# Patient Record
Sex: Male | Born: 1960 | Race: White | Hispanic: No | Marital: Single | Smoking: Former smoker
Health system: Southern US, Community
[De-identification: ages and names within clinical notes are randomized; demographics above are authoritative.]

## PROBLEM LIST (undated history)

## (undated) DIAGNOSIS — G4733 Obstructive sleep apnea (adult) (pediatric): Secondary | ICD-10-CM

## (undated) DIAGNOSIS — I10 Essential (primary) hypertension: Secondary | ICD-10-CM

## (undated) DIAGNOSIS — R0789 Other chest pain: Secondary | ICD-10-CM

## (undated) DIAGNOSIS — N529 Male erectile dysfunction, unspecified: Secondary | ICD-10-CM

## (undated) DIAGNOSIS — F419 Anxiety disorder, unspecified: Secondary | ICD-10-CM

## (undated) DIAGNOSIS — K219 Gastro-esophageal reflux disease without esophagitis: Secondary | ICD-10-CM

## (undated) DIAGNOSIS — E785 Hyperlipidemia, unspecified: Secondary | ICD-10-CM

## (undated) HISTORY — DX: Hyperlipidemia, unspecified: E78.5

## (undated) HISTORY — DX: Gastro-esophageal reflux disease without esophagitis: K21.9

## (undated) HISTORY — DX: Anxiety disorder, unspecified: F41.9

## (undated) HISTORY — DX: Male erectile dysfunction, unspecified: N52.9

## (undated) HISTORY — DX: Other chest pain: R07.89

## (undated) HISTORY — DX: Obstructive sleep apnea (adult) (pediatric): G47.33

## (undated) HISTORY — PX: HERNIA REPAIR: SHX51

---

## 1999-02-27 ENCOUNTER — Ambulatory Visit (HOSPITAL_BASED_OUTPATIENT_CLINIC_OR_DEPARTMENT_OTHER): Admission: RE | Admit: 1999-02-27 | Discharge: 1999-02-27 | Payer: Self-pay | Admitting: General Surgery

## 2002-10-02 ENCOUNTER — Emergency Department (HOSPITAL_COMMUNITY): Admission: EM | Admit: 2002-10-02 | Discharge: 2002-10-02 | Payer: Self-pay | Admitting: *Deleted

## 2002-10-06 ENCOUNTER — Ambulatory Visit (HOSPITAL_BASED_OUTPATIENT_CLINIC_OR_DEPARTMENT_OTHER): Admission: RE | Admit: 2002-10-06 | Discharge: 2002-10-06 | Payer: Self-pay | Admitting: Urology

## 2005-04-12 ENCOUNTER — Emergency Department (HOSPITAL_COMMUNITY): Admission: EM | Admit: 2005-04-12 | Discharge: 2005-04-12 | Payer: Self-pay | Admitting: Emergency Medicine

## 2006-05-02 DIAGNOSIS — L259 Unspecified contact dermatitis, unspecified cause: Secondary | ICD-10-CM | POA: Insufficient documentation

## 2007-01-23 DIAGNOSIS — R5383 Other fatigue: Secondary | ICD-10-CM | POA: Insufficient documentation

## 2007-01-23 DIAGNOSIS — R002 Palpitations: Secondary | ICD-10-CM | POA: Insufficient documentation

## 2010-10-21 ENCOUNTER — Emergency Department (HOSPITAL_COMMUNITY)
Admission: EM | Admit: 2010-10-21 | Discharge: 2010-10-22 | Discharge status: Against Medical Advice | Payer: 59 | Attending: Emergency Medicine | Admitting: Emergency Medicine

## 2010-10-21 ENCOUNTER — Emergency Department (HOSPITAL_COMMUNITY): Payer: 59

## 2010-10-21 DIAGNOSIS — R110 Nausea: Secondary | ICD-10-CM | POA: Insufficient documentation

## 2010-10-21 DIAGNOSIS — R61 Generalized hyperhidrosis: Secondary | ICD-10-CM | POA: Insufficient documentation

## 2010-10-21 DIAGNOSIS — R079 Chest pain, unspecified: Secondary | ICD-10-CM | POA: Insufficient documentation

## 2010-10-21 LAB — DIFFERENTIAL
Basophils Relative: 1 % (ref 0–1)
Eosinophils Absolute: 0.1 10*3/uL (ref 0.0–0.7)
Eosinophils Relative: 1 % (ref 0–5)
Monocytes Relative: 7 % (ref 3–12)
Neutrophils Relative %: 43 % (ref 43–77)

## 2010-10-21 LAB — CBC
MCH: 33.2 pg (ref 26.0–34.0)
Platelets: 225 10*3/uL (ref 150–400)
RBC: 4.85 MIL/uL (ref 4.22–5.81)
RDW: 12.8 % (ref 11.5–15.5)
WBC: 9.4 10*3/uL (ref 4.0–10.5)

## 2010-10-21 LAB — COMPREHENSIVE METABOLIC PANEL
Albumin: 3.9 g/dL (ref 3.5–5.2)
Alkaline Phosphatase: 81 U/L (ref 39–117)
BUN: 10 mg/dL (ref 6–23)
CO2: 26 mEq/L (ref 19–32)
Chloride: 106 mEq/L (ref 96–112)
Creatinine, Ser: 0.98 mg/dL (ref 0.4–1.5)
GFR calc non Af Amer: >60 mL/min (ref >60)
Glucose, Bld: 106 mg/dL — ABNORMAL HIGH (ref 70–99)
Total Bilirubin: 0.4 mg/dL (ref 0.3–1.2)

## 2010-10-21 LAB — POCT CARDIAC MARKERS: Troponin i, poc: <0.05 ng/mL (ref 0.00–0.09)

## 2010-10-23 DIAGNOSIS — R404 Transient alteration of awareness: Secondary | ICD-10-CM | POA: Insufficient documentation

## 2010-10-24 DIAGNOSIS — E781 Pure hyperglyceridemia: Secondary | ICD-10-CM | POA: Insufficient documentation

## 2010-12-21 DIAGNOSIS — M064 Inflammatory polyarthropathy: Secondary | ICD-10-CM | POA: Insufficient documentation

## 2010-12-21 DIAGNOSIS — W57XXXA Bitten or stung by nonvenomous insect and other nonvenomous arthropods, initial encounter: Secondary | ICD-10-CM | POA: Insufficient documentation

## 2010-12-21 DIAGNOSIS — Z0000 Encounter for general adult medical examination without abnormal findings: Secondary | ICD-10-CM | POA: Insufficient documentation

## 2011-01-04 DIAGNOSIS — E291 Testicular hypofunction: Secondary | ICD-10-CM | POA: Insufficient documentation

## 2011-01-04 DIAGNOSIS — K8010 Calculus of gallbladder with chronic cholecystitis without obstruction: Secondary | ICD-10-CM | POA: Insufficient documentation

## 2011-04-19 DIAGNOSIS — Z23 Encounter for immunization: Secondary | ICD-10-CM | POA: Insufficient documentation

## 2011-04-19 DIAGNOSIS — R030 Elevated blood-pressure reading, without diagnosis of hypertension: Secondary | ICD-10-CM | POA: Insufficient documentation

## 2011-07-26 DIAGNOSIS — N529 Male erectile dysfunction, unspecified: Secondary | ICD-10-CM | POA: Insufficient documentation

## 2013-09-10 ENCOUNTER — Ambulatory Visit: Payer: 59 | Admitting: *Deleted

## 2013-10-22 ENCOUNTER — Ambulatory Visit: Payer: 59 | Admitting: *Deleted

## 2014-10-24 ENCOUNTER — Encounter (HOSPITAL_COMMUNITY): Payer: Self-pay | Admitting: Emergency Medicine

## 2014-10-24 ENCOUNTER — Observation Stay (HOSPITAL_COMMUNITY)
Admission: EM | Admit: 2014-10-24 | Discharge: 2014-10-26 | Disposition: A | Discharge status: Home / Self Care | Payer: No Typology Code available for payment source | Attending: Surgery | Admitting: Surgery

## 2014-10-24 DIAGNOSIS — Z6834 Body mass index (BMI) 34.0-34.9, adult: Secondary | ICD-10-CM | POA: Insufficient documentation

## 2014-10-24 DIAGNOSIS — E669 Obesity, unspecified: Secondary | ICD-10-CM | POA: Diagnosis not present

## 2014-10-24 DIAGNOSIS — R1011 Right upper quadrant pain: Secondary | ICD-10-CM | POA: Diagnosis present

## 2014-10-24 DIAGNOSIS — I10 Essential (primary) hypertension: Secondary | ICD-10-CM | POA: Diagnosis not present

## 2014-10-24 DIAGNOSIS — F17210 Nicotine dependence, cigarettes, uncomplicated: Secondary | ICD-10-CM | POA: Diagnosis not present

## 2014-10-24 DIAGNOSIS — K8000 Calculus of gallbladder with acute cholecystitis without obstruction: Principal | ICD-10-CM | POA: Diagnosis present

## 2014-10-24 DIAGNOSIS — Z7982 Long term (current) use of aspirin: Secondary | ICD-10-CM | POA: Diagnosis not present

## 2014-10-24 DIAGNOSIS — R109 Unspecified abdominal pain: Secondary | ICD-10-CM

## 2014-10-24 HISTORY — DX: Essential (primary) hypertension: I10

## 2014-10-24 LAB — COMPREHENSIVE METABOLIC PANEL
ALT: 16 U/L (ref 0–53)
AST: 26 U/L (ref 0–37)
Albumin: 4.1 g/dL (ref 3.5–5.2)
Alkaline Phosphatase: 97 U/L (ref 39–117)
Anion gap: 8 (ref 5–15)
BUN: 14 mg/dL (ref 6–23)
CALCIUM: 9.6 mg/dL (ref 8.4–10.5)
CO2: 27 mmol/L (ref 19–32)
CREATININE: 0.98 mg/dL (ref 0.50–1.35)
Chloride: 105 mmol/L (ref 96–112)
GFR calc non Af Amer: >90 mL/min (ref >90)
GLUCOSE: 124 mg/dL — AB (ref 70–99)
Potassium: 4.2 mmol/L (ref 3.5–5.1)
SODIUM: 140 mmol/L (ref 135–145)
TOTAL PROTEIN: 7.4 g/dL (ref 6.0–8.3)
Total Bilirubin: 1.1 mg/dL (ref 0.3–1.2)

## 2014-10-24 LAB — CBC
HEMATOCRIT: 48.1 % (ref 39.0–52.0)
HEMOGLOBIN: 17.4 g/dL — AB (ref 13.0–17.0)
MCH: 33.6 pg (ref 26.0–34.0)
MCHC: 36.2 g/dL — AB (ref 30.0–36.0)
MCV: 92.9 fL (ref 78.0–100.0)
Platelets: 250 10*3/uL (ref 150–400)
RBC: 5.18 MIL/uL (ref 4.22–5.81)
RDW: 13.2 % (ref 11.5–15.5)
WBC: 14.2 10*3/uL — ABNORMAL HIGH (ref 4.0–10.5)

## 2014-10-24 LAB — LIPASE, BLOOD: LIPASE: 23 U/L (ref 11–59)

## 2014-10-24 MED ORDER — ONDANSETRON HCL 4 MG/2ML IJ SOLN
4.0000 mg | Freq: Once | INTRAMUSCULAR | Status: AC
  Administered 2014-10-24: 4 mg via INTRAVENOUS
  Filled 2014-10-24: qty 2

## 2014-10-24 MED ORDER — MORPHINE SULFATE 4 MG/ML IJ SOLN
4.0000 mg | Freq: Once | INTRAMUSCULAR | Status: AC
  Administered 2014-10-24: 4 mg via INTRAVENOUS
  Filled 2014-10-24: qty 1

## 2014-10-24 NOTE — ED Notes (Signed)
Pt reports hx of gall stones 

## 2014-10-24 NOTE — ED Notes (Signed)
Pt presents with RUQ pain onset Friday- pt describes pain as cramping sensation that has gotten worse since Friday.  Pt was seen at an urgent care center today and given Rx for Protonix but has had no relief.  Admits to nausea, denies diarrhea.

## 2014-10-24 NOTE — ED Provider Notes (Signed)
CSN: [id number]     Arrival date & time 10/24/14  1833 History   First MD Initiated Contact with Patient 10/24/14 2322     Chief Complaint  Patient presents with  . Abdominal Pain     (Consider location/radiation/quality/duration/timing/severity/associated sxs/prior Treatment) Patient is a 54 y.o. male presenting with abdominal pain. The history is provided by the patient and medical records.  Abdominal Pain Associated symptoms: nausea     This is a 54 year old male with past medical history significant for hypertension, presenting to the ED for right upper quadrant abdominal pain which is been ongoing for the past 4 days. Patient states pain is constant, but worse after eating. He endorses some nausea but denies vomiting. States he has had decreased PO intake because of this and states he is afraid to eat.  Patient does admit to eating a lot of fatty and greasy foods-- BBQ, fried chicken, etc.  Denies fever, chills, sweats.  No flank pain or urinary symptoms.  BMs regular.  No chest pain or SOB.  Patient has hx of hernia repair and gallstones.  States he was evaluated for gallstones by general surgery 10+ years ago but told he did not need surgery at that time.  VSS.  Past Medical History  Diagnosis Date  . Hypertension    History reviewed. No pertinent past surgical history. No family history on file. History  Substance Use Topics  . Smoking status: Never Smoker   . Smokeless tobacco: Not on file  . Alcohol Use: No    Review of Systems  Gastrointestinal: Positive for nausea and abdominal pain.  All other systems reviewed and are negative.     Allergies  Review of patient's allergies indicates no known allergies.  Home Medications   Prior to Admission medications   Medication Sig Start Date End Date Taking? Authorizing Provider  alum & mag hydroxide-simeth (MAALOX/MYLANTA) 200-200-20 MG/5ML suspension Take 30 mLs by mouth every 6 (six) hours as needed for indigestion or  heartburn.   Yes Historical Provider, MD  aspirin EC 81 MG tablet Take 81 mg by mouth daily.   Yes Historical Provider, MD  atorvastatin (LIPITOR) 10 MG tablet Take 10 mg by mouth daily.   Yes Historical Provider, MD  lisinopril (PRINIVIL,ZESTRIL) 10 MG tablet Take 10 mg by mouth daily.   Yes Historical Provider, MD  pantoprazole (PROTONIX) 40 MG tablet Take 40 mg by mouth daily.   Yes Historical Provider, MD   BP 140/89 mmHg  Pulse 95  Temp(Src) 98.5 F (36.9 C)  Resp 18  Ht 6' (1.829 m)  Wt 260 lb (117.935 kg)  BMI 35.25 kg/m2  SpO2 95%   Physical Exam  Constitutional: He is oriented to person, place, and time. He appears well-developed and well-nourished. No distress.  HENT:  Head: Normocephalic and atraumatic.  Mouth/Throat: Oropharynx is clear and moist.  Eyes: Conjunctivae and EOM are normal. Pupils are equal, round, and reactive to light.  Neck: Normal range of motion. Neck supple.  Cardiovascular: Normal rate, regular rhythm and normal heart sounds.   Pulmonary/Chest: Effort normal and breath sounds normal.  Abdominal: Soft. Bowel sounds are normal. There is tenderness in the right upper quadrant and epigastric area. There is no rigidity, no guarding and no CVA tenderness.  Musculoskeletal: Normal range of motion.  Neurological: He is alert and oriented to person, place, and time.  Skin: Skin is warm and dry. He is not diaphoretic.  Psychiatric: He has a normal mood and affect.  Nursing  note and vitals reviewed.   ED Course  Procedures (including critical care time) Labs Review Labs Reviewed  CBC - Abnormal; Notable for the following:    WBC 14.2 (*)    Hemoglobin 17.4 (*)    MCHC 36.2 (*)    All other components within normal limits  COMPREHENSIVE METABOLIC PANEL - Abnormal; Notable for the following:    Glucose, Bld 124 (*)    All other components within normal limits  LIPASE, BLOOD    Imaging Review No results found.   EKG Interpretation None       MDM   Final diagnoses:  Abdominal pain, unspecified abdominal location   54 year old male with right upper quadrant abdominal pain for the past 3 days. Pain is notably worse after eating. He does admit to history of gallstones. On exam, patient is afebrile and nontoxic in appearance. He has tenderness in his RUQ and epigastrium.  Labwork is overall reassuring, leukocytosis noted at 14.2.  Will plan for u/s abdomen.  Patient given morphine and zofran.  1:18 AM U/s abdomen pending for evaluation of possible cholecystitis vs symptomatic gallstones.  Care signed out to PA Upstill-- will follow results and dispo accordingly.  Garlon HatchetLisa M Cierah Crader, PA-C 10/25/14 0119  Garlon HatchetLisa M Kally Cadden, PA-C 10/25/14 0120  Mirian MoMatthew Gentry, MD 10/25/14 [phone]

## 2014-10-25 ENCOUNTER — Observation Stay (HOSPITAL_COMMUNITY): Payer: No Typology Code available for payment source | Admitting: Critical Care Medicine

## 2014-10-25 ENCOUNTER — Emergency Department (HOSPITAL_COMMUNITY): Payer: No Typology Code available for payment source

## 2014-10-25 ENCOUNTER — Encounter (HOSPITAL_COMMUNITY): Payer: Self-pay | Admitting: Critical Care Medicine

## 2014-10-25 ENCOUNTER — Encounter (HOSPITAL_COMMUNITY)
Admission: EM | Disposition: A | Discharge status: Home / Self Care | Payer: Self-pay | Source: Home / Self Care | Attending: Emergency Medicine

## 2014-10-25 DIAGNOSIS — K8000 Calculus of gallbladder with acute cholecystitis without obstruction: Secondary | ICD-10-CM | POA: Diagnosis present

## 2014-10-25 HISTORY — PX: CHOLECYSTECTOMY: SHX55

## 2014-10-25 LAB — SURGICAL PCR SCREEN
MRSA, PCR: NEGATIVE
Staphylococcus aureus: NEGATIVE

## 2014-10-25 SURGERY — LAPAROSCOPIC CHOLECYSTECTOMY
Anesthesia: General | Site: Abdomen

## 2014-10-25 MED ORDER — GLYCOPYRROLATE 0.2 MG/ML IJ SOLN
INTRAMUSCULAR | Status: AC
  Filled 2014-10-25: qty 1

## 2014-10-25 MED ORDER — HYDROMORPHONE HCL 1 MG/ML IJ SOLN
1.0000 mg | INTRAMUSCULAR | Status: DC | PRN
  Administered 2014-10-25: 1 mg via INTRAVENOUS
  Filled 2014-10-25: qty 1

## 2014-10-25 MED ORDER — OXYCODONE-ACETAMINOPHEN 5-325 MG PO TABS
ORAL_TABLET | ORAL | Status: AC
  Administered 2014-10-25: 2 via ORAL
  Filled 2014-10-25: qty 2

## 2014-10-25 MED ORDER — GLYCOPYRROLATE 0.2 MG/ML IJ SOLN
INTRAMUSCULAR | Status: DC | PRN
  Administered 2014-10-25: 0.4 mg via INTRAVENOUS

## 2014-10-25 MED ORDER — CEFTRIAXONE SODIUM IN DEXTROSE 40 MG/ML IV SOLN
2.0000 g | INTRAVENOUS | Status: DC
  Administered 2014-10-25 – 2014-10-26 (×2): 2 g via INTRAVENOUS
  Filled 2014-10-25 (×2): qty 50

## 2014-10-25 MED ORDER — FENTANYL CITRATE 0.05 MG/ML IJ SOLN
INTRAMUSCULAR | Status: AC
  Filled 2014-10-25: qty 5

## 2014-10-25 MED ORDER — PROPOFOL 10 MG/ML IV BOLUS
INTRAVENOUS | Status: DC | PRN
  Administered 2014-10-25: 200 mg via INTRAVENOUS

## 2014-10-25 MED ORDER — PROPOFOL 10 MG/ML IV BOLUS
INTRAVENOUS | Status: AC
  Filled 2014-10-25: qty 20

## 2014-10-25 MED ORDER — NEOSTIGMINE METHYLSULFATE 10 MG/10ML IV SOLN
INTRAVENOUS | Status: DC | PRN
  Administered 2014-10-25: 3 mg via INTRAVENOUS

## 2014-10-25 MED ORDER — VECURONIUM BROMIDE 10 MG IV SOLR
INTRAVENOUS | Status: DC | PRN
  Administered 2014-10-25: 3 mg via INTRAVENOUS

## 2014-10-25 MED ORDER — PANTOPRAZOLE SODIUM 40 MG PO TBEC
40.0000 mg | DELAYED_RELEASE_TABLET | Freq: Every day | ORAL | Status: DC
  Administered 2014-10-25: 40 mg via ORAL
  Filled 2014-10-25: qty 1

## 2014-10-25 MED ORDER — CEFAZOLIN SODIUM-DEXTROSE 2-3 GM-% IV SOLR
INTRAVENOUS | Status: DC | PRN
  Administered 2014-10-25: 2 g via INTRAVENOUS

## 2014-10-25 MED ORDER — BUPIVACAINE-EPINEPHRINE (PF) 0.25% -1:200000 IJ SOLN
INTRAMUSCULAR | Status: AC
  Filled 2014-10-25: qty 30

## 2014-10-25 MED ORDER — ONDANSETRON HCL 4 MG/2ML IJ SOLN
INTRAMUSCULAR | Status: AC
  Filled 2014-10-25: qty 2

## 2014-10-25 MED ORDER — FENTANYL CITRATE 0.05 MG/ML IJ SOLN
INTRAMUSCULAR | Status: DC | PRN
  Administered 2014-10-25: 100 ug via INTRAVENOUS

## 2014-10-25 MED ORDER — KETOROLAC TROMETHAMINE 30 MG/ML IJ SOLN
INTRAMUSCULAR | Status: DC | PRN
  Administered 2014-10-25: 30 mg via INTRAVENOUS

## 2014-10-25 MED ORDER — SUCCINYLCHOLINE CHLORIDE 20 MG/ML IJ SOLN
INTRAMUSCULAR | Status: DC | PRN
  Administered 2014-10-25: 120 mg via INTRAVENOUS

## 2014-10-25 MED ORDER — NEOSTIGMINE METHYLSULFATE 10 MG/10ML IV SOLN
INTRAVENOUS | Status: AC
  Filled 2014-10-25: qty 1

## 2014-10-25 MED ORDER — MIDAZOLAM HCL 2 MG/2ML IJ SOLN
INTRAMUSCULAR | Status: AC
  Filled 2014-10-25: qty 2

## 2014-10-25 MED ORDER — LIDOCAINE HCL (CARDIAC) 20 MG/ML IV SOLN
INTRAVENOUS | Status: DC | PRN
  Administered 2014-10-25: 70 mg via INTRAVENOUS

## 2014-10-25 MED ORDER — BUPIVACAINE-EPINEPHRINE 0.25% -1:200000 IJ SOLN
INTRAMUSCULAR | Status: DC | PRN
  Administered 2014-10-25: 20 mL

## 2014-10-25 MED ORDER — LACTATED RINGERS IV SOLN
INTRAVENOUS | Status: DC
  Administered 2014-10-25 (×2): via INTRAVENOUS

## 2014-10-25 MED ORDER — ONDANSETRON HCL 4 MG/2ML IJ SOLN: 4.0000 mg | Freq: Four times a day (QID) | INTRAMUSCULAR | Status: DC | PRN

## 2014-10-25 MED ORDER — PHENYLEPHRINE HCL 10 MG/ML IJ SOLN
INTRAMUSCULAR | Status: DC | PRN
  Administered 2014-10-25: 40 ug via INTRAVENOUS

## 2014-10-25 MED ORDER — MIDAZOLAM HCL 5 MG/5ML IJ SOLN
INTRAMUSCULAR | Status: DC | PRN
  Administered 2014-10-25: 2 mg via INTRAVENOUS

## 2014-10-25 MED ORDER — POTASSIUM CHLORIDE IN NACL 20-0.9 MEQ/L-% IV SOLN
INTRAVENOUS | Status: DC
  Administered 2014-10-25 (×2): via INTRAVENOUS
  Filled 2014-10-25 (×7): qty 1000

## 2014-10-25 MED ORDER — KETOROLAC TROMETHAMINE 30 MG/ML IJ SOLN
INTRAMUSCULAR | Status: AC
  Filled 2014-10-25: qty 1

## 2014-10-25 MED ORDER — SODIUM CHLORIDE 0.9 % IR SOLN
Status: DC | PRN
  Administered 2014-10-25: 1000 mL

## 2014-10-25 MED ORDER — OXYCODONE-ACETAMINOPHEN 5-325 MG PO TABS
1.0000 | ORAL_TABLET | ORAL | Status: DC | PRN
  Administered 2014-10-25 – 2014-10-26 (×2): 2 via ORAL
  Filled 2014-10-25: qty 2

## 2014-10-25 MED ORDER — MORPHINE SULFATE 2 MG/ML IJ SOLN: 1.0000 mg | INTRAMUSCULAR | Status: DC | PRN

## 2014-10-25 MED ORDER — LISINOPRIL 10 MG PO TABS
10.0000 mg | ORAL_TABLET | Freq: Every day | ORAL | Status: DC
  Administered 2014-10-25: 10 mg via ORAL
  Filled 2014-10-25: qty 1

## 2014-10-25 MED ORDER — 0.9 % SODIUM CHLORIDE (POUR BTL) OPTIME
TOPICAL | Status: DC | PRN
  Administered 2014-10-25: 1000 mL

## 2014-10-25 SURGICAL SUPPLY — 42 items
APPLIER CLIP 5 13 M/L LIGAMAX5 (MISCELLANEOUS) ×4
APR CLP MED LRG 5 ANG JAW (MISCELLANEOUS) ×1
BAG SPEC RTRVL LRG 6X4 10 (ENDOMECHANICALS) ×2
BLADE SURG CLIPPER 3M 9600 (MISCELLANEOUS) IMPLANT
CANISTER SUCTION 2500CC (MISCELLANEOUS) ×4 IMPLANT
CHLORAPREP W/TINT 26ML (MISCELLANEOUS) ×4 IMPLANT
CLIP APPLIE 5 13 M/L LIGAMAX5 (MISCELLANEOUS) ×2 IMPLANT
COVER MAYO STAND STRL (DRAPES) ×4 IMPLANT
COVER SURGICAL LIGHT HANDLE (MISCELLANEOUS) ×4 IMPLANT
DRAPE C-ARM 42X72 X-RAY (DRAPES) ×4 IMPLANT
DRAPE LAPAROSCOPIC ABDOMINAL (DRAPES) ×4 IMPLANT
ELECT REM PT RETURN 9FT ADLT (ELECTROSURGICAL) ×4
ELECTRODE REM PT RTRN 9FT ADLT (ELECTROSURGICAL) ×2 IMPLANT
GLOVE BIOGEL PI IND STRL 7.0 (GLOVE) ×4 IMPLANT
GLOVE BIOGEL PI IND STRL 8.5 (GLOVE) ×2 IMPLANT
GLOVE BIOGEL PI INDICATOR 7.0 (GLOVE) ×4
GLOVE BIOGEL PI INDICATOR 8.5 (GLOVE) ×2
GLOVE ECLIPSE 8.0 STRL XLNG CF (GLOVE) ×4 IMPLANT
GLOVE SURG SIGNA 7.5 PF LTX (GLOVE) ×4 IMPLANT
GLOVE SURG SS PI 7.0 STRL IVOR (GLOVE) ×8 IMPLANT
GOWN STRL REUS W/ TWL LRG LVL3 (GOWN DISPOSABLE) ×4 IMPLANT
GOWN STRL REUS W/ TWL XL LVL3 (GOWN DISPOSABLE) ×2 IMPLANT
GOWN STRL REUS W/TWL LRG LVL3 (GOWN DISPOSABLE) ×8
GOWN STRL REUS W/TWL XL LVL3 (GOWN DISPOSABLE) ×4
KIT BASIN OR (CUSTOM PROCEDURE TRAY) ×4 IMPLANT
KIT ROOM TURNOVER OR (KITS) ×4 IMPLANT
LIQUID BAND (GAUZE/BANDAGES/DRESSINGS) ×4 IMPLANT
NS IRRIG 1000ML POUR BTL (IV SOLUTION) ×4 IMPLANT
PAD ARMBOARD 7.5X6 YLW CONV (MISCELLANEOUS) ×4 IMPLANT
POUCH SPECIMEN RETRIEVAL 10MM (ENDOMECHANICALS) ×4 IMPLANT
SCISSORS LAP 5X35 DISP (ENDOMECHANICALS) ×4 IMPLANT
SET CHOLANGIOGRAPH 5 50 .035 (SET/KITS/TRAYS/PACK) ×4 IMPLANT
SET IRRIG TUBING LAPAROSCOPIC (IRRIGATION / IRRIGATOR) ×4 IMPLANT
SLEEVE ENDOPATH XCEL 5M (ENDOMECHANICALS) ×8 IMPLANT
SPECIMEN JAR SMALL (MISCELLANEOUS) ×4 IMPLANT
SUT MON AB 4-0 PC3 18 (SUTURE) ×8 IMPLANT
TOWEL OR 17X24 6PK STRL BLUE (TOWEL DISPOSABLE) ×4 IMPLANT
TOWEL OR 17X26 10 PK STRL BLUE (TOWEL DISPOSABLE) ×4 IMPLANT
TRAY LAPAROSCOPIC (CUSTOM PROCEDURE TRAY) ×4 IMPLANT
TROCAR XCEL BLUNT TIP 100MML (ENDOMECHANICALS) ×4 IMPLANT
TROCAR XCEL NON-BLD 5MMX100MML (ENDOMECHANICALS) ×4 IMPLANT
TUBING INSUFFLATION (TUBING) ×4 IMPLANT

## 2014-10-25 NOTE — Transfer of Care (Signed)
Immediate Anesthesia Transfer of Care Note  Patient: Jose Austin  Procedure(s) Performed: Procedure(s): LAPAROSCOPIC CHOLECYSTECTOMY (N/A)  Patient Location: PACU  Anesthesia Type:General  Level of Consciousness: awake, alert , oriented and patient cooperative  Airway & Oxygen Therapy: Patient Spontanous Breathing, Patient connected to nasal cannula oxygen and Patient connected to face mask oxygen  Post-op Assessment: Report given to RN, Post -op Vital signs reviewed and stable and Patient moving all extremities  Post vital signs: Reviewed and stable  Last Vitals:  Filed Vitals:   10/25/14 1716  BP: 140/83  Pulse:   Temp: 36.8 C  Resp: 17    Complications: No apparent anesthesia complications

## 2014-10-25 NOTE — Anesthesia Procedure Notes (Signed)
Procedure Name: Intubation Date/Time: 10/25/2014 4:12 PM Performed by: Coralee RudFLORES, Yohannes Waibel Pre-anesthesia Checklist: Patient identified, Emergency Drugs available, Suction available and Patient being monitored Patient Re-evaluated:Patient Re-evaluated prior to inductionOxygen Delivery Method: Circle system utilized Preoxygenation: Pre-oxygenation with 100% oxygen Intubation Type: IV induction Ventilation: Mask ventilation without difficulty Laryngoscope Size: Miller and 3 Grade View: Grade I Tube type: Oral Tube size: 7.5 mm Number of attempts: 1 Airway Equipment and Method: Stylet Secured at: 23 cm Tube secured with: Tape Dental Injury: Teeth and Oropharynx as per pre-operative assessment

## 2014-10-25 NOTE — Progress Notes (Signed)
Patient ID: Jose BarriosJames E Austin, male   DOB: Oct 12, 1960, 54 y.o.   MRN: [id number]  Plan lap chole today I discussed the procedure in detail.    We discussed the risks and benefits of a laparoscopic cholecystectomy and possible cholangiogram including, but not limited to bleeding, infection, injury to surrounding structures such as the intestine or liver, bile leak, retained gallstones, need to convert to an open procedure, prolonged diarrhea, blood clots such as  DVT, common bile duct injury, anesthesia risks, and possible need for additional procedures.  The likelihood of improvement in symptoms and return to the patient's normal status is good. We discussed the typical post-operative recovery course.

## 2014-10-25 NOTE — Progress Notes (Signed)
INITIAL NUTRITION ASSESSMENT  DOCUMENTATION CODES Per approved criteria  -Obesity Unspecified   INTERVENTION: -RD to follow for diet advancement  NUTRITION DIAGNOSIS: Inadequate oral intake related to inability to eat as evidenced by NPO.   Goal: Pt will meet >90% of estimated needs  Monitor:  Diet advancement, PO intake, labs, weight changes, I/O's  Reason for Assessment: MST=2  54 y.o. male  Admitting Dx: <principal problem not specified>  This is a 54 yo male who presents with four days of RUQ abdominal pain, mild nausea, poor appetite, and abdominal bloating. The patient has known for years that he had gallstones, but never decided to have surgery. He denies any fever. He denies any change in bowel movements.  ASSESSMENT: Pt is scheduled for a lap chole today. Pt about to go down to surgery soon, so nutrition focused physical exam deferred at this time. Noted poor appetite x 4 days PTA due to abdominal pain. RD will follow for diet advancement and supplement diet post surgery as warranted.  Labs reviewed. Glucose: 124.   Height: Ht Readings from Last 1 Encounters:  10/25/14 6' (1.829 m)    Weight: Wt Readings from Last 1 Encounters:  10/25/14 251 lb 3.2 oz (113.944 kg)    Ideal Body Weight: 178#  % Ideal Body Weight: 141%  Wt Readings from Last 10 Encounters:  10/25/14 251 lb 3.2 oz (113.944 kg)    Usual Body Weight: unknown  % Usual Body Weight: unknown  BMI:  Body mass index is 34.06 kg/(m^2). Obesity, class I  Estimated Nutritional Needs: Kcal: 2000-2200 Protein: 100-110 grams Fluid: 2.0-2.2 L  Skin: WDL  Diet Order: Diet NPO time specified Except for: Ice Chips, Sips with Meds  EDUCATION NEEDS: -Education not appropriate at this time  No intake or output data in the 24 hours ending 10/25/14 1249  Last BM: 10/20/14  Labs:   Recent Labs Lab 10/24/14 1932  NA 140  K 4.2  CL 105  CO2 27  BUN 14  CREATININE 0.98  CALCIUM 9.6   GLUCOSE 124*    CBG (last 3)  No results for input(s): GLUCAP in the last 72 hours.  Scheduled Meds: . cefTRIAXone (ROCEPHIN)  IV  2 g Intravenous Q24H  . lisinopril  10 mg Oral Daily  . pantoprazole  40 mg Oral Daily    Continuous Infusions: . 0.9 % NaCl with KCl 20 mEq / L 125 mL/hr at 10/25/14 0449    Past Medical History  Diagnosis Date  . Hypertension     History reviewed. No pertinent past surgical history.  Almadelia Looman A. Mayford KnifeWilliams, RD, LDN, CDE Pager: [phone] After hours Pager: [phone]

## 2014-10-25 NOTE — H&P (Signed)
Jose Austin is an 54 y.o. male.   Chief Complaint:  RUQ abdominal pain, nausea, bloating HPI: This is a 55 yo male who presents with four days of RUQ abdominal pain, mild nausea, poor appetite, and abdominal bloating.  The patient has known for years that he had gallstones, but never decided to have surgery.  He denies any fever.  He denies any change in bowel movements.  Past Medical History  Diagnosis Date  . Hypertension     PSH - laparoscopic bilateral inguinal hernia repairs - Dr. Zella Richer  No family history on file. Social History:  reports that he has never smoked. He does not have any smokeless tobacco history on file. He reports that he does not drink alcohol. His drug history is not on file.  Allergies: No Known Allergies  Prior to Admission medications   Medication Sig Start Date End Date Taking? Authorizing Provider  alum & mag hydroxide-simeth (MAALOX/MYLANTA) 200-200-20 MG/5ML suspension Take 30 mLs by mouth every 6 (six) hours as needed for indigestion or heartburn.   Yes Historical Provider, MD  aspirin EC 81 MG tablet Take 81 mg by mouth daily.   Yes Historical Provider, MD  atorvastatin (LIPITOR) 10 MG tablet Take 10 mg by mouth daily.   Yes Historical Provider, MD  lisinopril (PRINIVIL,ZESTRIL) 10 MG tablet Take 10 mg by mouth daily.   Yes Historical Provider, MD  pantoprazole (PROTONIX) 40 MG tablet Take 40 mg by mouth daily.   Yes Historical Provider, MD     Results for orders placed or performed during the hospital encounter of 10/24/14 (from the past 48 hour(s))  CBC     Status: Abnormal   Collection Time: 10/24/14  7:32 PM  Result Value Ref Range   WBC 14.2 (H) 4.0 - 10.5 K/uL   RBC 5.18 4.22 - 5.81 MIL/uL   Hemoglobin 17.4 (H) 13.0 - 17.0 g/dL   HCT 48.1 39.0 - 52.0 %   MCV 92.9 78.0 - 100.0 fL   MCH 33.6 26.0 - 34.0 pg   MCHC 36.2 (H) 30.0 - 36.0 g/dL   RDW 13.2 11.5 - 15.5 %   Platelets 250 150 - 400 K/uL  Comprehensive metabolic panel      Status: Abnormal   Collection Time: 10/24/14  7:32 PM  Result Value Ref Range   Sodium 140 135 - 145 mmol/L   Potassium 4.2 3.5 - 5.1 mmol/L   Chloride 105 96 - 112 mmol/L   CO2 27 19 - 32 mmol/L   Glucose, Bld 124 (H) 70 - 99 mg/dL   BUN 14 6 - 23 mg/dL   Creatinine, Ser 0.98 0.50 - 1.35 mg/dL   Calcium 9.6 8.4 - 10.5 mg/dL   Total Protein 7.4 6.0 - 8.3 g/dL   Albumin 4.1 3.5 - 5.2 g/dL   AST 26 0 - 37 U/L   ALT 16 0 - 53 U/L   Alkaline Phosphatase 97 39 - 117 U/L   Total Bilirubin 1.1 0.3 - 1.2 mg/dL   GFR calc non Af Amer >90 >90 mL/min   GFR calc Af Amer >90 >90 mL/min    Comment: (NOTE) The eGFR has been calculated using the CKD EPI equation. This calculation has not been validated in all clinical situations. eGFR's persistently <90 mL/min signify possible Chronic Kidney Disease.    Anion gap 8 5 - 15  Lipase, blood     Status: None   Collection Time: 10/24/14  7:32 PM  Result Value Ref  Range   Lipase 23 11 - 59 U/L   US Abdomen Complete  10/25/2014   CLINICAL DATA:  Acute onset of right upper quadrant abdominal pain and epigastric pain. Initial encounter.  EXAM: ULTRASOUND ABDOMEN COMPLETE  COMPARISON:  None.  FINDINGS: Gallbladder: Multiple stones are seen partially filling the gallbladder, measuring up to 1.6 cm in size. The gallbladder wall is borderline normal in thickness. No pericholecystic fluid is seen. No ultrasonographic Murphy's sign is elicited.  Common bile duct: Diameter: 0.5 cm, within normal limits in caliber.  Liver: No focal lesion identified. Within normal limits in parenchymal echogenicity.  IVC: No abnormality visualized.  Pancreas: Not characterized due to overlying bowel gas.  Spleen: Size and appearance within normal limits.  Right Kidney: Length: 11.6 cm. Echogenicity within normal limits. No mass or hydronephrosis visualized.  Left Kidney: Length: 12.3 cm. Echogenicity within normal limits. No mass or hydronephrosis visualized.  Abdominal aorta: No  aneurysm visualized.  Other findings: None.  IMPRESSION: 1. No acute abnormality seen within the abdomen. 2. Cholelithiasis; gallbladder otherwise unremarkable in appearance.   Electronically Signed   By: Garald Balding M.D.   On: 10/25/2014 01:41    Review of Systems  Constitutional: Negative for weight loss.  HENT: Negative for ear discharge, ear pain, hearing loss and tinnitus.   Eyes: Negative for blurred vision, double vision, photophobia and pain.  Respiratory: Negative for cough, sputum production and shortness of breath.   Cardiovascular: Negative for chest pain.  Gastrointestinal: Positive for nausea and abdominal pain. Negative for vomiting.  Genitourinary: Negative for dysuria, urgency, frequency and flank pain.  Musculoskeletal: Negative for myalgias, back pain, joint pain, falls and neck pain.  Neurological: Negative for dizziness, tingling, sensory change, focal weakness, loss of consciousness and headaches.  Endo/Heme/Allergies: Does not bruise/bleed easily.  Psychiatric/Behavioral: Negative for depression, memory loss and substance abuse. The patient is not nervous/anxious.     Blood pressure 126/68, pulse 93, temperature 98.5 F (36.9 C), resp. rate 16, height 6' (1.829 m), weight 117.935 kg (260 lb), SpO2 93 %. Physical Exam  WDWN in NAD HEENT:  EOMI, sclera anicteric Neck:  No masses, no thyromegaly Lungs:  CTA bilaterally; normal respiratory effort CV:  Regular rate and rhythm; no murmurs Abd:  Obese, mildly distended, tender in RUQ; no palpable masses Ext:  Well-perfused; no edema Skin:  Warm, dry; no sign of jaundice  Assessment/Plan Acute calculus cholecystitis.  Admit for IV hydration, IV antibiotics Laparoscopic cholecystectomy this admission by Dr. Bayard Hugger K. 10/25/2014, 3:25 AM

## 2014-10-25 NOTE — Progress Notes (Signed)
Report called to Michelle in short stay. Hector Venne T  

## 2014-10-25 NOTE — Anesthesia Preprocedure Evaluation (Signed)
Anesthesia Evaluation  Patient identified by MRN, date of birth, ID band Patient awake    Reviewed: Allergy & Precautions, NPO status , Patient's Chart, lab work & pertinent test results  Airway        Dental  (+) Dental Advisory Given   Pulmonary Current Smoker,          Cardiovascular hypertension, Pt. on medications     Neuro/Psych    GI/Hepatic   Endo/Other    Renal/GU      Musculoskeletal   Abdominal   Peds  Hematology   Anesthesia Other Findings   Reproductive/Obstetrics                             Anesthesia Physical Anesthesia Plan  ASA: II  Anesthesia Plan: General   Post-op Pain Management:    Induction: Intravenous  Airway Management Planned: Oral ETT  Additional Equipment:   Intra-op Plan:   Post-operative Plan: Extubation in OR  Informed Consent: I have reviewed the patients History and Physical, chart, labs and discussed the procedure including the risks, benefits and alternatives for the proposed anesthesia with the patient or authorized representative who has indicated his/her understanding and acceptance.   Dental advisory given  Plan Discussed with: Anesthesiologist and Surgeon  Anesthesia Plan Comments:         Anesthesia Quick Evaluation

## 2014-10-25 NOTE — Op Note (Signed)
Laparoscopic Cholecystectomy Procedure Note  Indications: This patient presents with symptomatic gallbladder disease and will undergo laparoscopic cholecystectomy.  Pre-operative Diagnosis: Calculus of gallbladder with acute cholecystitis, without mention of obstruction  Post-operative Diagnosis: Same  Surgeon: Abigail MiyamotoBLACKMAN,Ellington Greenslade A   Assistants: 0  Anesthesia: General endotracheal anesthesia  ASA Class: 2  Procedure Details  The patient was seen again in the Holding Room. The risks, benefits, complications, treatment options, and expected outcomes were discussed with the patient. The possibilities of reaction to medication, pulmonary aspiration, perforation of viscus, bleeding, recurrent infection, finding a normal gallbladder, the need for additional procedures, failure to diagnose a condition, the possible need to convert to an open procedure, and creating a complication requiring transfusion or operation were discussed with the patient. The likelihood of improving the patient's symptoms with return to their baseline status is good.  The patient and/or family concurred with the proposed plan, giving informed consent. The site of surgery properly noted. The patient was taken to Operating Room, identified as Jose BarriosJames E Austin and the procedure verified as Laparoscopic Cholecystectomy with Intraoperative Cholangiogram. A Time Out was held and the above information confirmed.  Prior to the induction of general anesthesia, antibiotic prophylaxis was administered. General endotracheal anesthesia was then administered and tolerated well. After the induction, the abdomen was prepped with Chloraprep and draped in sterile fashion. The patient was positioned in the supine position.  Local anesthetic agent was injected into the skin near the umbilicus and an incision made. We dissected down to the abdominal fascia with blunt dissection.  The fascia was incised vertically and we entered the peritoneal cavity  bluntly.  A pursestring suture of 0-Vicryl was placed around the fascial opening.  The Hasson cannula was inserted and secured with the stay suture.  Pneumoperitoneum was then created with CO2 and tolerated well without any adverse changes in the patient's vital signs. A 5-mm port was placed in the subxiphoid position.  Two 5-mm ports were placed in the right upper quadrant. All skin incisions were infiltrated with a local anesthetic agent before making the incision and placing the trocars.   We positioned the patient in reverse Trendelenburg, tilted slightly to the patient's left.  The gallbladder was identified and found to be tense, distended, and inflamed.  I had to aspirate bile from the gallbladder in order to grasp it.  The fundus was grasped and retracted cephalad. Adhesions were lysed bluntly and with the electrocautery where indicated, taking care not to injure any adjacent organs or viscus. The infundibulum was grasped and retracted laterally, exposing the peritoneum overlying the triangle of Calot. This was then divided and exposed in a blunt fashion. The cystic duct was clearly identified and bluntly dissected circumferentially. A critical view of the cystic duct and cystic artery was obtained.  The cystic duct was then ligated with clips and divided. The cystic artery was, dissected free, ligated with clips and divided as well.   The gallbladder was dissected from the liver bed in retrograde fashion with the electrocautery. The gallbladder was removed and placed in an Endocatch sac. The liver bed was irrigated and inspected. Hemostasis was achieved with the electrocautery. Copious irrigation was utilized and was repeatedly aspirated until clear.  The gallbladder and Endocatch sac were then removed through the umbilical port site.  The pursestring suture was used to close the umbilical fascia.    We again inspected the right upper quadrant for hemostasis.  Pneumoperitoneum was released as we  removed the trocars.  4-0 Monocryl was  used to close the skin.   Benzoin, steri-strips, and clean dressings were applied. The patient was then extubated and brought to the recovery room in stable condition. Instrument, sponge, and needle counts were correct at closure and at the conclusion of the case.   Findings: Cholecystitis with Cholelithiasis  Estimated Blood Loss: Minimal         Drains: 0         Specimens: Gallbladder           Complications: None; patient tolerated the procedure well.         Disposition: PACU - hemodynamically stable.         Condition: stable

## 2014-10-25 NOTE — ED Notes (Signed)
Attempt to call report to floor x1. 

## 2014-10-25 NOTE — ED Provider Notes (Signed)
RUQ pain x 3 days after eating H/o gall stones, over 10 years ago Getting US - no fever, abnormalities on labs x leukocytosis of 14.   US showing gall stones without cholecystitis. Re-examination: Still very tender in RUQ abdomen. No vomiting or fever. Leukocytosis without shift. Discussed with Dr. Harlon Florseui (surgery) who will consult on the patient given persistent marked tenderness.   Dr. Harlon Florseui admits the patient to the surgical service.   Elpidio AnisShari Syre Knerr, PA-C 10/25/14 [phone]  Mirian MoMatthew Gentry, MD 10/26/14 [phone]

## 2014-10-26 ENCOUNTER — Encounter (HOSPITAL_COMMUNITY): Payer: Self-pay | Admitting: Surgery

## 2014-10-26 MED ORDER — OXYCODONE-ACETAMINOPHEN 5-325 MG PO TABS: 1.0000 | ORAL_TABLET | Freq: Four times a day (QID) | ORAL | Status: DC | PRN

## 2014-10-26 NOTE — Anesthesia Postprocedure Evaluation (Signed)
  Anesthesia Post-op Note  Patient: Jose Austin  Procedure(s) Performed: Procedure(s): LAPAROSCOPIC CHOLECYSTECTOMY (N/A)  Patient Location: PACU  Anesthesia Type:General  Level of Consciousness: awake  Airway and Oxygen Therapy: Patient Spontanous Breathing  Post-op Pain: mild  Post-op Assessment: Post-op Vital signs reviewed  Post-op Vital Signs: Reviewed  Last Vitals:  Filed Vitals:   10/26/14 0543  BP: 103/58  Pulse: 95  Temp: 37.9 C  Resp: 17    Complications: No apparent anesthesia complications

## 2014-10-26 NOTE — Discharge Instructions (Signed)

## 2014-10-26 NOTE — Progress Notes (Signed)
D/c to home with wife via wheelchair. Breathing regular and unlabored on room air.  VSS pain denied. Instructions given along with rx for pain and understood by patient and family

## 2014-10-26 NOTE — Progress Notes (Signed)
Patient ID: Jose BarriosJames E Muldrow, male   DOB: September 29, 1960, 54 y.o.   MRN: [id number]  Doing well Comfortable abd soft  Plan: Discharge home

## 2014-10-26 NOTE — Discharge Summary (Signed)
Physician Discharge Summary  Patient ID: GIANKARLO LEAMER MRN: [id number] DOB/AGE: [birth year] 54 y.o.  Admit date: 10/24/2014 Discharge date: 10/26/2014  Admitting Diagnosis: Acute calculous cholecystitis   Discharge Diagnosis Patient Active Problem List   Diagnosis Date Noted  . Acute calculous cholecystitis 10/25/2014    Consultants none  Imaging: US Abdomen Complete  10/25/2014   CLINICAL DATA:  Acute onset of right upper quadrant abdominal pain and epigastric pain. Initial encounter.  EXAM: ULTRASOUND ABDOMEN COMPLETE  COMPARISON:  None.  FINDINGS: Gallbladder: Multiple stones are seen partially filling the gallbladder, measuring up to 1.6 cm in size. The gallbladder wall is borderline normal in thickness. No pericholecystic fluid is seen. No ultrasonographic Murphy's sign is elicited.  Common bile duct: Diameter: 0.5 cm, within normal limits in caliber.  Liver: No focal lesion identified. Within normal limits in parenchymal echogenicity.  IVC: No abnormality visualized.  Pancreas: Not characterized due to overlying bowel gas.  Spleen: Size and appearance within normal limits.  Right Kidney: Length: 11.6 cm. Echogenicity within normal limits. No mass or hydronephrosis visualized.  Left Kidney: Length: 12.3 cm. Echogenicity within normal limits. No mass or hydronephrosis visualized.  Abdominal aorta: No aneurysm visualized.  Other findings: None.  IMPRESSION: 1. No acute abnormality seen within the abdomen. 2. Cholelithiasis; gallbladder otherwise unremarkable in appearance.   Electronically Signed   By: Roanna Raider M.D.   On: 10/25/2014 01:41    Procedures Laparoscopic cholecystectomy---Dr. Vladimir Faster Course:  Daimian Sudberry is a 54 year old male who presented to [facility] with abdominal pain.  .  Workup showed acute calculous cholecystitis.  Patient was admitted and underwent procedure listed above.  Tolerated procedure well and was transferred to the floor.  Diet was advanced as  tolerated.  On POD#1, the patient was voiding well, tolerating diet, ambulating well, pain well controlled, vital signs stable, incisions c/d/i and felt stable for discharge home.  Medication risks, benefits and therapeutic alternatives were reviewed with the patient.  He verbalizes understanding. Warning signs that warrant further evaluation were reviewed. Patient will follow up in our office in 3 weeks and knows to call with questions or concerns.  Physical Exam: General:  Alert, NAD, pleasant, comfortable Lungs: CTA Cardio: S1S2 RRR.  No murmurs, gallops or rubs.  2/2 pulses.  No edema.  Abd:  Soft, ND, mild tenderness, incisions C/D/I    Medication List    TAKE these medications        alum & mag hydroxide-simeth 200-200-20 MG/5ML suspension  Commonly known as:  MAALOX/MYLANTA  Take 30 mLs by mouth every 6 (six) hours as needed for indigestion or heartburn.     aspirin EC 81 MG tablet  Take 81 mg by mouth daily.     atorvastatin 10 MG tablet  Commonly known as:  LIPITOR  Take 10 mg by mouth daily.     lisinopril 10 MG tablet  Commonly known as:  PRINIVIL,ZESTRIL  Take 10 mg by mouth daily.     oxyCODONE-acetaminophen 5-325 MG per tablet  Commonly known as:  PERCOCET/ROXICET  Take 1-2 tablets by mouth every 6 (six) hours as needed for moderate pain.     pantoprazole 40 MG tablet  Commonly known as:  PROTONIX  Take 40 mg by mouth daily.             Follow-up Information    Follow up with [facility] On 11/15/2014.   Why:  arrive by 2PM for a 2:30PM post op check  Contact information:   [address] [place] [phone]      Signed: Ashok Norrismina Cordelro Gautreau, [facility]NP-BC [facility] [phone]  10/26/2014, 8:50 AM

## 2015-02-06 ENCOUNTER — Encounter: Payer: Self-pay | Admitting: Cardiovascular Disease

## 2015-02-06 ENCOUNTER — Ambulatory Visit (INDEPENDENT_AMBULATORY_CARE_PROVIDER_SITE_OTHER): Payer: No Typology Code available for payment source | Admitting: Cardiovascular Disease

## 2015-02-06 VITALS — BP 130/82 | HR 68 | Ht 72.0 in | Wt 268.3 lb

## 2015-02-06 DIAGNOSIS — E785 Hyperlipidemia, unspecified: Secondary | ICD-10-CM | POA: Insufficient documentation

## 2015-02-06 DIAGNOSIS — G4710 Hypersomnia, unspecified: Secondary | ICD-10-CM | POA: Diagnosis not present

## 2015-02-06 DIAGNOSIS — R0789 Other chest pain: Secondary | ICD-10-CM | POA: Insufficient documentation

## 2015-02-06 DIAGNOSIS — G4733 Obstructive sleep apnea (adult) (pediatric): Secondary | ICD-10-CM | POA: Insufficient documentation

## 2015-02-06 DIAGNOSIS — I10 Essential (primary) hypertension: Secondary | ICD-10-CM | POA: Insufficient documentation

## 2015-02-06 DIAGNOSIS — R400 Somnolence: Secondary | ICD-10-CM

## 2015-02-06 NOTE — Assessment & Plan Note (Addendum)
Patient is a one-year history of left precordial and upper extremity pain which last for hours at a time. He does have risk factors including hypertension and hyperlipidemia. He had a Myoview stress test performed 4 years ago by Dr. Alanda AmassWeintraub for chest pain which was normal. I'm going to obtain a exercise Myoview stress test to rule out an ischemic etiology. If his cardiac workup is negative he would benefit from having a cervical MRI to rule out cervical radiculopathy.

## 2015-02-06 NOTE — Assessment & Plan Note (Signed)
History of hyperlipidemia on atorvastatin 10 mg a day followed by his PCP 

## 2015-02-06 NOTE — Progress Notes (Signed)
.     02/06/2015 Johnnette Barrios   01/24/1961  [id number]  Primary Physician Herb Grays, MD Primary Cardiologist: Runell Gess MD Roseanne Reno   HPI:  Mr. Carico is a 54 year old moderately overweight married Caucasian male father of 2 children, and father to 2 grandchildren who was referred by his primary care provider Haze Rushing PA-C for cardiovascular evaluation because of chest pain. 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 He saw Dr. Alanda Amass 4 years ago for chest pain. A Myoview stress test at that time was normal. His factors include treated hypertension and hyperlipidemia. He did have an uncomplicated laparoscopic cholecystectomy in March of this year. For the past year he has had left precordial pain and left upper extremity pain the last for hours at a time.   Current Outpatient Prescriptions  Medication Sig Dispense Refill  . aspirin EC 81 MG tablet Take 81 mg by mouth daily.    Marland Kitchen atorvastatin (LIPITOR) 10 MG tablet Take 10 mg by mouth daily.    Marland Kitchen lisinopril (PRINIVIL,ZESTRIL) 10 MG tablet Take 10 mg by mouth daily.    Marland Kitchen omeprazole (PRILOSEC) 40 MG capsule Take 40 mg by mouth daily.     No current facility-administered medications for this visit.    No Known Allergies  History   Social History  . Marital Status: Single    Spouse Name: N/A  . Number of Children: N/A  . Years of Education: N/A   Occupational History  . Not on file.   Social History Main Topics  . Smoking status: Never Smoker   . Smokeless tobacco: Not on file  . Alcohol Use: No  . Drug Use: Not on file  . Sexual Activity: Not on file   Other Topics Concern  . Not on file   Social History Narrative     Review of Systems: General: negative for chills, fever, night sweats or weight changes.  Cardiovascular: negative for chest pain, dyspnea on exertion, edema, orthopnea, palpitations, paroxysmal nocturnal dyspnea or shortness of breath Dermatological: negative  for rash Respiratory: negative for cough or wheezing Urologic: negative for hematuria Abdominal: negative for nausea, vomiting, diarrhea, bright red blood per rectum, melena, or hematemesis Neurologic: negative for visual changes, syncope, or dizziness All other systems reviewed and are otherwise negative except as noted above.    Blood pressure 130/82, pulse 68, height 6' (1.829 m), weight 268 lb 4.8 oz (121.7 kg).  General appearance: alert and no distress Neck: no adenopathy, no carotid bruit, no JVD, supple, symmetrical, trachea midline and thyroid not enlarged, symmetric, no tenderness/mass/nodules Lungs: clear to auscultation bilaterally Heart: regular rate and rhythm, S1, S2 normal, no murmur, click, rub or gallop Extremities: extremities normal, atraumatic, no cyanosis or edema  EKG normal sinus rhythm at 68 without ST or T-wave changes. I personally reviewed this EKG  ASSESSMENT AND PLAN:   Atypical chest pain Patient is a one-year history of left precordial and upper extremity pain which last for hours at a time. He does have risk factors including hypertension and hyperlipidemia. He had a Myoview stress test performed 4 years ago by Dr. Alanda Amass for chest pain which was normal. I'm going to obtain a exercise Myoview stress test to rule out an ischemic etiology.  Obstructive sleep apnea Patient is a history of daytime fatigue and snoring. I'm going to get an outpatient sleep study to rule out obstructive sleep apnea      Runell Gess MD [facility], [facility] 02/06/2015  3:51 PM

## 2015-02-06 NOTE — Patient Instructions (Signed)
Testing/Procedures: Your physician has requested that you have en exercise stress myoview. For further information please visit [email or web address]. Please follow instruction sheet, as given.  Your physician has recommended that you have a sleep study. This test records several body functions during sleep, including: brain activity, eye movement, oxygen and carbon dioxide blood levels, heart rate and rhythm, breathing rate and rhythm, the flow of air through your mouth and nose, snoring, body muscle movements, and chest and belly movement.  Follow-Up: Dr Allyson SabalBerry recommends to follow-up as needed.

## 2015-02-06 NOTE — Assessment & Plan Note (Signed)
History of hypertension with blood pressure measured 130/82 on lisinopril. Continue current meds at current dosing

## 2015-02-06 NOTE — Assessment & Plan Note (Signed)
Patient is a history of daytime fatigue and snoring. I'm going to get an outpatient sleep study to rule out obstructive sleep apnea

## 2015-02-10 ENCOUNTER — Telehealth (HOSPITAL_COMMUNITY): Payer: Self-pay

## 2015-02-10 NOTE — Telephone Encounter (Signed)
Encounter complete. 

## 2015-02-14 ENCOUNTER — Telehealth (HOSPITAL_COMMUNITY): Payer: Self-pay

## 2015-02-14 NOTE — Telephone Encounter (Signed)
Encounter complete. 

## 2015-02-15 ENCOUNTER — Encounter: Payer: Self-pay | Admitting: *Deleted

## 2015-02-15 ENCOUNTER — Ambulatory Visit (HOSPITAL_COMMUNITY)
Admission: RE | Admit: 2015-02-15 | Discharge: 2015-02-15 | Disposition: A | Discharge status: Home / Self Care | Payer: No Typology Code available for payment source | Source: Ambulatory Visit | Attending: Cardiovascular Disease | Admitting: Cardiovascular Disease

## 2015-02-15 DIAGNOSIS — R0609 Other forms of dyspnea: Secondary | ICD-10-CM | POA: Diagnosis not present

## 2015-02-15 DIAGNOSIS — Z87891 Personal history of nicotine dependence: Secondary | ICD-10-CM | POA: Insufficient documentation

## 2015-02-15 DIAGNOSIS — E669 Obesity, unspecified: Secondary | ICD-10-CM | POA: Diagnosis not present

## 2015-02-15 DIAGNOSIS — Z6836 Body mass index (BMI) 36.0-36.9, adult: Secondary | ICD-10-CM | POA: Diagnosis not present

## 2015-02-15 DIAGNOSIS — R42 Dizziness and giddiness: Secondary | ICD-10-CM | POA: Insufficient documentation

## 2015-02-15 DIAGNOSIS — Z8249 Family history of ischemic heart disease and other diseases of the circulatory system: Secondary | ICD-10-CM | POA: Diagnosis not present

## 2015-02-15 DIAGNOSIS — I10 Essential (primary) hypertension: Secondary | ICD-10-CM | POA: Insufficient documentation

## 2015-02-15 DIAGNOSIS — R0789 Other chest pain: Secondary | ICD-10-CM | POA: Diagnosis not present

## 2015-02-15 DIAGNOSIS — G4733 Obstructive sleep apnea (adult) (pediatric): Secondary | ICD-10-CM | POA: Insufficient documentation

## 2015-02-15 LAB — MYOCARDIAL PERFUSION IMAGING
CHL CUP MPHR: 165 {beats}/min
CHL CUP NUCLEAR SRS: 2
CSEPED: 10 min
CSEPEW: 11.7 METS
LV dias vol: 115 mL
LV sys vol: 51 mL
Peak HR: 169 {beats}/min
Percent HR: 102 %
RPE: 14
Rest HR: 67 {beats}/min
SDS: 0
SSS: 2
TID: 1.1

## 2015-02-15 MED ORDER — TECHNETIUM TC 99M SESTAMIBI GENERIC - CARDIOLITE
30.6000 | Freq: Once | INTRAVENOUS | Status: AC | PRN
  Administered 2015-02-15: 31 via INTRAVENOUS

## 2015-02-15 MED ORDER — TECHNETIUM TC 99M SESTAMIBI GENERIC - CARDIOLITE
10.9000 | Freq: Once | INTRAVENOUS | Status: AC | PRN
  Administered 2015-02-15: 10.9 via INTRAVENOUS

## 2015-04-28 ENCOUNTER — Encounter (HOSPITAL_BASED_OUTPATIENT_CLINIC_OR_DEPARTMENT_OTHER): Payer: No Typology Code available for payment source

## 2015-06-29 ENCOUNTER — Ambulatory Visit (INDEPENDENT_AMBULATORY_CARE_PROVIDER_SITE_OTHER): Payer: No Typology Code available for payment source | Admitting: Neurology

## 2015-06-29 ENCOUNTER — Encounter: Payer: Self-pay | Admitting: Neurology

## 2015-06-29 VITALS — BP 129/83 | HR 81 | Resp 20 | Ht 72.0 in | Wt 276.0 lb

## 2015-06-29 DIAGNOSIS — R0683 Snoring: Secondary | ICD-10-CM | POA: Insufficient documentation

## 2015-06-29 DIAGNOSIS — R0689 Other abnormalities of breathing: Secondary | ICD-10-CM | POA: Insufficient documentation

## 2015-06-29 DIAGNOSIS — G4730 Sleep apnea, unspecified: Secondary | ICD-10-CM | POA: Diagnosis not present

## 2015-06-29 DIAGNOSIS — G4726 Circadian rhythm sleep disorder, shift work type: Secondary | ICD-10-CM

## 2015-06-29 DIAGNOSIS — R51 Headache: Secondary | ICD-10-CM | POA: Diagnosis not present

## 2015-06-29 DIAGNOSIS — R519 Headache, unspecified: Secondary | ICD-10-CM

## 2015-06-29 NOTE — Patient Instructions (Signed)
Hypersomnia Hypersomnia is when you feel extremely tired during the day even though you're getting plenty of sleep at night. You may need to take naps during the day, and you may also be extremely difficult to wake up when you are sleeping.  CAUSES  The cause of your hypersomnia may not be known. Hypersomnia may be caused by:   Medicines.  Sleep disorders, such as narcolepsy.  Trauma or injury to your head or nervous system.  Using drugs or alcohol.  Tumors.  Medical conditions, such as depression or hypothyroidism.  Genetics. SIGNS AND SYMPTOMS  The main symptoms of hypersomnia include:   Feeling extremely tired throughout the day.  Being very difficult to wake up.  Sleeping for longer and longer periods.  Taking naps throughout the day. Other symptoms may include:   Feeling:  Restless.  Annoyed.  Anxious.  Low energy.  Having difficulty:  Remembering.  Speaking.  Thinking.  Losing your appetite.  Experiencing hallucinations. DIAGNOSIS  Hypersomnia may be diagnosed by:  Medical history and physical exam. This will include a sleep history.  Completing sleep logs.  Tests may also be done, such as:  Polysomnography.  Multiple sleep latency test (MSLT). TREATMENT  There is no cure for hypersomnia, but treatment can be very effective in helping manage the condition. Treatment may include:  Lifestyle and sleeping strategies to help cope with the condition.  Stimulant medicines.  Treating any underlying causes of hypersomnia. HOME CARE INSTRUCTIONS  Take medicines only as directed by your health care provider.  Schedule short naps for when you feel sleepiest during the day. Tell your employer or teachers that you have hypersomnia. You may be able to adjust your schedule to include time for naps.  Avoid drinking alcohol or caffeinated beverages.  Do not eat a heavy meal before bedtime. Eat at about the same times every day.  Do not drive or  operate heavy machinery if you are sleepy.  Do not swim or go out on the water without a life jacket.  If possible, adjust your schedule so that you do not have to work or be active at night.  Keep all follow-up visits as directed by your health care provider. This is important. SEEK MEDICAL CARE IF:   You have new symptoms.  Your symptoms get worse. SEEK IMMEDIATE MEDICAL CARE IF:  You have serious thoughts of hurting yourself or someone else.   This information is not intended to replace advice given to you by your health care provider. Make sure you discuss any questions you have with your health care provider.   Document Released: 07/19/2002 Document Revised: 08/19/2014 Document Reviewed: 03/03/2014 Elsevier Interactive Patient Education 2016 Elsevier Inc.  

## 2015-06-29 NOTE — Progress Notes (Signed)
Provider:  Melvyn Novas, M D  Referring Provider: Herb Grays, MD Primary Care Physician:  Maryelizabeth Rowan, MD  Chief Complaint  Patient presents with  . New Patient (Initial Visit)    snoring, daytime fatigue, rm 10, alone    HPI:  Jose Austin is a 54 y.o. male    Is seen here as a referral  from Dr. Collins Scotland for a sleep consultation, Mr. Jose Austin reports that his wife has noted him to snore and sometimes he seems to wake up from it.. 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 He has noted some daytime somnolence. But he has not struggled to stay awake when physically active. In August he underwent a cholecystectomy he had a cardiac stress test which returned normal, he has a high stress level job but he has always been able to manage his stress rather well and he does not think that it has affected his sleep. He feels that he may not rest as well that his sleep is not as refreshing and restorative as it used to be. He is a hip history of hypertension but this has been well controlled with medication: one single medication.   Mr. 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 This has led to some weight gain.  His usual bedtime is around 8:30 PM, the marital bedroom is cool, quiet and dark. He tosses and turns does not have a specific sleep position but sleeps on several pillows. He seldom has a bathroom break at night. He reports waking up several times at night without knowing why. He feels that his sleep is easily disturbed by sounds light etc. that may in the past not has bothered him at all. He rises at 4:30 every morning needs an alarm. He feels not refreshed or restored necessarily at that time of day. At work he is exposed to day light. He drinks several caffeinated beverages in the morning and  throughout the day including coffee, iced tea and sodas. He returns from work at variable times. The earliest would be 7:30 and the latest 11 PM.     Review of Systems: Epworth score at 4-5 points, fatigue severity score at 40 points. PHQ 2 Depression score at 1. No evidence of depression. Out of a complete 14 system review, the patient complains of only the following symptoms, and all other reviewed systems are negative. Snoring, daytime fatigue, loss of stamina.  Social History   Social History  . Marital Status: Single    Spouse Name: N/A  . Number of Children: N/A  . Years of Education: N/A   Occupational History  . [employer]     works 14 hours a day   Social History Main Topics  . Smoking status: Former Smoker    Quit date: 08/12/1998  . Smokeless tobacco: Not on file  . Alcohol Use: No  . Drug Use: No  . Sexual Activity: Not on file   Other Topics Concern  . Not on file   Social History Narrative   Drinks 2-3 sodas daily.    Family History  Problem Relation Age of Onset  . Heart attack Mother   . Thyroid disease Mother   . Neuropathy Father   . Prostate cancer Father   . Thyroid disease Sister   . Diabetes  Maternal Grandmother   . Kidney failure Paternal Grandfather     Past Medical History  Diagnosis Date  . Hypertension   . Atypical chest pain   . Hyperlipidemia   . GERD (gastroesophageal reflux disease)   . ED (erectile dysfunction)   . OSA (obstructive sleep apnea)   . Anxiety     Past Surgical History  Procedure Laterality Date  . Cholecystectomy N/A 10/25/2014    Procedure: LAPAROSCOPIC CHOLECYSTECTOMY;  Surgeon: Abigail Miyamotoouglas Blackman, MD;  Location[facility];  Service: General;  Laterality: N/A;  . Hernia repair      Current Outpatient Prescriptions  Medication Sig Dispense Refill  . atorvastatin (LIPITOR) 10 MG tablet Take 10 mg by mouth daily.    Marland Kitchen. lisinopril (PRINIVIL,ZESTRIL) 10 MG tablet Take 10 mg by mouth daily.    Marland Kitchen. losartan (COZAAR)  25 MG tablet Take 25 mg by mouth daily.    . pantoprazole (PROTONIX) 40 MG tablet Take 40 mg by mouth daily.    . sildenafil (VIAGRA) 50 MG tablet Take 50 mg by mouth as needed for erectile dysfunction.     No current facility-administered medications for this visit.    Allergies as of 06/29/2015  . (No Known Allergies)    Vitals: BP 129/83 mmHg  Pulse 81  Resp 20  Ht 6' (1.829 m)  Wt 276 lb (125.193 kg)  BMI 37.42 kg/m2 Last Weight:  Wt Readings from Last 1 Encounters:  06/29/15 276 lb (125.193 kg)   Last Height:   Ht Readings from Last 1 Encounters:  06/29/15 6' (1.829 m)    Physical exam:  General: The patient is awake, alert and appears not in acute distress. The patient is well groomed. Head: Normocephalic, atraumatic. Neck is supple. Mallampati 4 , neck circumference:19,  Cardiovascular:  Regular rate and rhythm , without  murmurs or carotid bruit, and without distended neck veins. Respiratory: Lungs are clear to auscultation. Skin:  Without evidence of edema, or rash Trunk: BMI is elevated and patient  has normal posture.  Neurologic exam : The patient is awake and alert, oriented to place and time.  Memory subjective  described as intact. There is a normal attention span & concentration ability. Speech is fluent without  dysarthria, dysphonia or aphasia. Mood and affect are appropriate.  Cranial nerves: Pupils are equal and briskly reactive to light. Funduscopic exam without evidence of pallor or edema. Extraocular movements  in vertical and horizontal planes intact and without nystagmus.  Visual fields by finger perimetry are intact. Hearing to finger rub intact.  Facial sensation intact to fine touch. Facial motor strength is symmetric and tongue and uvula move midline. Tongue protrusion into either cheek is normal. Shoulder shrug is normal.   Motor exam:  Normal tone ,muscle bulk and symmetric  strength in all extremities.  Sensory:  Fine touch, pinprick and  vibration were tested in all extremities. Proprioception was normal.  Coordination: Rapid alternating movements in the fingers/hands were normal without evidence of ataxia, dysmetria or tremor.  Gait and station: Patient walks without assistive device and is able unassisted to climb up to the exam table. Strength within normal limits.   Deep tendon reflexes: in the  upper and lower extremities are symmetric and intact. Babinski maneuver response is downgoing.   Assessment:  After 30 minutes of physical and neurologic examination, review of laboratory studies, imaging, neurophysiology testing and pre-existing records, assessment is that of :   Witnessed snoring and gasping, fragmented sleep, daytime fatigue.  Mr. Martyn Ehrich has an elevated body mass index and a very narrow upper airway is a risk factors for obstructive sleep apnea. I do not see comorbidities such as diabetes or hypertension playing a bigger role in his case. He has no lymphadenopathy he had no history of thyroid disease or carotid artery disease. To have asthma or COPD.   Dr. Duanne Guess has already discussed some weight loss approaches with him. DASH diet but will reduce sodium and helps him to track calories. Exercise recommendations were also given by Dr. Duanne Guess.     I would like for the patient to undergo a sleep study with a split-night protocol, he wakes up with a dry mouth and sometimes with headaches the headaches actually more frequent. For this reason I will add a capnography. He does not get woken up by headaches.he has cervicalgia, tension neck pain.      Porfirio Mylar Markis Langland MD 06/29/2015

## 2016-05-30 IMAGING — NM NM MISC PROCEDURE
6 series · 36 of 36 positions shown · non-contrast
Comparison: none

[Series 1: wbr_r-proj_st wbr rest · 6.40mm/px · 6 of 64 frames shown]
[frame 6/64]
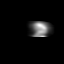
[frame 16/64]
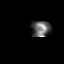
[frame 27/64]
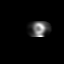
[frame 38/64]
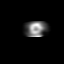
[frame 48/64]
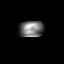
[frame 59/64]
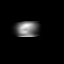

[Series 1: wbr rest · 6.40mm/px · 6 of 64 frames shown]
[frame 6/64]
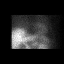
[frame 16/64]
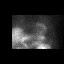
[frame 27/64]
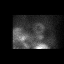
[frame 38/64]
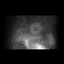
[frame 48/64]
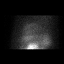
[frame 59/64]
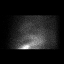

[Series 2: wbr stress-gsp · 6.40mm/px · 6 of 493 frames shown]
[frame 42/493  full-range]
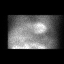
[frame 124/493  full-range]
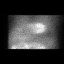
[frame 206/493  full-range]
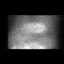
[frame 288/493  full-range]
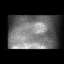
[frame 370/493  full-range]
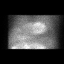
[frame 452/493  full-range]
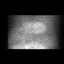

[Series 2: wbr_s-proj_st wbr stress-gsp · 6.40mm/px · 6 of 512 frames shown]
[frame 43/512]
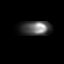
[frame 128/512]
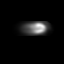
[frame 214/512]
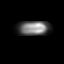
[frame 299/512]
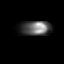
[frame 384/512]
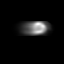
[frame 470/512]
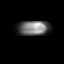

[Series 3: wbr_s-proj_st wbr stress-sum-em · 6.40mm/px · 6 of 64 frames shown]
[frame 6/64]
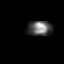
[frame 16/64]
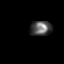
[frame 27/64]
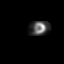
[frame 38/64]
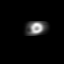
[frame 48/64]
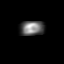
[frame 59/64]
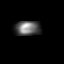

[Series 3: wbr stress-sum-em · 6.40mm/px · 6 of 64 frames shown]
[frame 6/64]
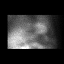
[frame 16/64]
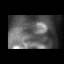
[frame 27/64]
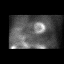
[frame 38/64]
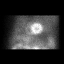
[frame 48/64]
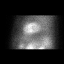
[frame 59/64]
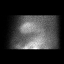

[36 of 36 positions shown; findings below may reference images not displayed]

Canned report from images found in remote index.

Refer to host system for actual result text.

## 2019-06-23 ENCOUNTER — Other Ambulatory Visit: Payer: Self-pay

## 2019-06-23 DIAGNOSIS — Z20822 Contact with and (suspected) exposure to covid-19: Secondary | ICD-10-CM

## 2019-06-24 LAB — NOVEL CORONAVIRUS, NAA: SARS-CoV-2, NAA: NOT DETECTED

## 2019-11-02 ENCOUNTER — Ambulatory Visit: Payer: No Typology Code available for payment source | Admitting: Nurse Practitioner

## 2019-11-04 ENCOUNTER — Encounter: Payer: Self-pay | Admitting: Nurse Practitioner

## 2019-11-04 ENCOUNTER — Other Ambulatory Visit: Payer: Self-pay

## 2019-11-04 ENCOUNTER — Ambulatory Visit (INDEPENDENT_AMBULATORY_CARE_PROVIDER_SITE_OTHER): Payer: No Typology Code available for payment source | Admitting: Nurse Practitioner

## 2019-11-04 VITALS — BP 140/88 | HR 80 | Temp 97.6°F | Resp 18 | Ht 72.0 in | Wt 297.0 lb

## 2019-11-04 DIAGNOSIS — E785 Hyperlipidemia, unspecified: Secondary | ICD-10-CM | POA: Diagnosis not present

## 2019-11-04 DIAGNOSIS — Z125 Encounter for screening for malignant neoplasm of prostate: Secondary | ICD-10-CM | POA: Diagnosis not present

## 2019-11-04 DIAGNOSIS — I10 Essential (primary) hypertension: Secondary | ICD-10-CM | POA: Diagnosis not present

## 2019-11-04 LAB — COMPLETE METABOLIC PANEL WITH GFR
AG Ratio: 1.9 (calc) (ref 1.0–2.5)
ALT: 13 U/L (ref 9–46)
AST: 19 U/L (ref 10–35)
Albumin: 4.3 g/dL (ref 3.6–5.1)
Alkaline phosphatase (APISO): 90 U/L (ref 35–144)
BUN: 20 mg/dL (ref 7–25)
CO2: 27 mmol/L (ref 20–32)
Calcium: 9.8 mg/dL (ref 8.6–10.3)
Chloride: 106 mmol/L (ref 98–110)
Creat: 1.13 mg/dL (ref 0.70–1.33)
GFR, Est African American: 82 mL/min/{1.73_m2} (ref >=60)
GFR, Est Non African American: 71 mL/min/{1.73_m2} (ref >=60)
Globulin: 2.3 g/dL (calc) (ref 1.9–3.7)
Glucose, Bld: 135 mg/dL — ABNORMAL HIGH (ref 65–99)
Potassium: 4 mmol/L (ref 3.5–5.3)
Sodium: 143 mmol/L (ref 135–146)
Total Bilirubin: 0.5 mg/dL (ref 0.2–1.2)
Total Protein: 6.6 g/dL (ref 6.1–8.1)

## 2019-11-04 LAB — CBC WITH DIFFERENTIAL/PLATELET
Absolute Monocytes: 490 cells/uL (ref 200–950)
Basophils Absolute: 71 cells/uL (ref 0–200)
Basophils Relative: 0.9 %
Eosinophils Absolute: 87 cells/uL (ref 15–500)
Eosinophils Relative: 1.1 %
HCT: 47.7 % (ref 38.5–50.0)
Hemoglobin: 16.1 g/dL (ref 13.2–17.1)
Lymphs Abs: 3365 cells/uL (ref 850–3900)
MCH: 32.1 pg (ref 27.0–33.0)
MCHC: 33.8 g/dL (ref 32.0–36.0)
MCV: 95.2 fL (ref 80.0–100.0)
MPV: 10.2 fL (ref 7.5–12.5)
Monocytes Relative: 6.2 %
Neutro Abs: 3887 cells/uL (ref 1500–7800)
Neutrophils Relative %: 49.2 %
Platelets: 266 10*3/uL (ref 140–400)
RBC: 5.01 10*6/uL (ref 4.20–5.80)
RDW: 12.6 % (ref 11.0–15.0)
Total Lymphocyte: 42.6 %
WBC: 7.9 10*3/uL (ref 3.8–10.8)

## 2019-11-04 LAB — PSA: PSA: 0.3 ng/mL (ref <=4.0)

## 2019-11-04 LAB — LIPID PANEL
Cholesterol: 217 mg/dL — ABNORMAL HIGH (ref <200)
HDL: 51 mg/dL (ref >=40)
LDL Cholesterol (Calc): 129 mg/dL (calc) — ABNORMAL HIGH
Non-HDL Cholesterol (Calc): 166 mg/dL (calc) — ABNORMAL HIGH (ref <130)
Total CHOL/HDL Ratio: 4.3 (calc) (ref <5.0)
Triglycerides: 228 mg/dL — ABNORMAL HIGH (ref <150)

## 2019-11-04 NOTE — Progress Notes (Signed)
New Patient Office Visit  Subjective:  Patient ID: Jose Austin, male    DOB: [birth year]  Age: 59 y.o. MRN: [id number]  CC:  Chief Complaint  Patient presents with  . Establish Care    NP    HPI Jose Austin is a 72 year caucasian male presenting for establish care. Denied cp/ct sxs, gu/gi sxs, pain, sob, edema, or recently falls. He is trying to exercise on treadmill and reduce calories. He recently retired not by choice r/t change in management.   Past Medical History:  Diagnosis Date  . Anxiety   . Atypical chest pain   . ED (erectile dysfunction)   . GERD (gastroesophageal reflux disease)   . Hyperlipidemia   . Hypertension   . OSA (obstructive sleep apnea)     Past Surgical History:  Procedure Laterality Date  . CHOLECYSTECTOMY N/A 10/25/2014   Procedure: LAPAROSCOPIC CHOLECYSTECTOMY;  Surgeon: Abigail Miyamoto, MD;  Location: [facility] OR;  Service: General;  Laterality: N/A;  . HERNIA REPAIR      Family History  Problem Relation Age of Onset  . Heart attack Mother   . Thyroid disease Mother   . Neuropathy Father   . Prostate cancer Father   . Thyroid disease Sister   . Diabetes Maternal Grandmother   . Kidney failure Paternal Grandfather     Social History   Socioeconomic History  . Marital status: Single    Spouse name: Not on file  . Number of children: Not on file  . Years of education: Not on file  . Highest education level: Not on file  Occupational History  . Occupation: [employer]    Comment: works 14 hours a day  Tobacco Use  . Smoking status: Former Smoker    Quit date: 08/12/1998    Years since quitting: 21.2  Substance and Sexual Activity  . Alcohol use: No    Alcohol/week: 0.0 standard drinks  . Drug use: No  . Sexual activity: Not on file  Other Topics Concern  . Not on file  Social History Narrative   Drinks 2-3 sodas daily.   Social Determinants of Health   Financial Resource Strain:   . Difficulty of Paying Living Expenses:    Food Insecurity:   . Worried About Programme researcher, broadcasting/film/video in the Last Year:   . Barista in the Last Year:   Transportation Needs:   . Freight forwarder (Medical):   Marland Kitchen Lack of Transportation (Non-Medical):   Physical Activity:   . Days of Exercise per Week:   . Minutes of Exercise per Session:   Stress:   . Feeling of Stress :   Social Connections:   . Frequency of Communication with Friends and Family:   . Frequency of Social Gatherings with Friends and Family:   . Attends Religious Services:   . Active Member of Clubs or Organizations:   . Attends Banker Meetings:   Marland Kitchen Marital Status:   Intimate Partner Violence:   . Fear of Current or Ex-Partner:   . Emotionally Abused:   Marland Kitchen Physically Abused:   . Sexually Abused:     ROS Review of Systems  All other systems reviewed and are negative.   Objective:   Today's Vitals: BP 140/88 (BP Location: Left Arm, Patient Position: Sitting, Cuff Size: Large)   Pulse 80   Temp 97.6 F (36.4 C) (Temporal)   Resp 18   Ht 6' (1.829 m)   Wt 297  lb (134.7 kg)   SpO2 96%   BMI 40.28 kg/m   Physical Exam Vitals and nursing note reviewed.  Constitutional:      Appearance: Normal appearance. He is well-developed and well-groomed. He is not ill-appearing or toxic-appearing.  HENT:     Head: Normocephalic.     Right Ear: Hearing, tympanic membrane, ear canal and external ear normal.     Left Ear: Hearing, tympanic membrane, ear canal and external ear normal.     Nose: Nose normal.     Mouth/Throat:     Lips: Pink.     Mouth: Mucous membranes are moist.     Pharynx: Oropharynx is clear.  Eyes:     General: Lids are normal. Lids are everted, no foreign bodies appreciated.     Extraocular Movements: Extraocular movements intact.     Conjunctiva/sclera: Conjunctivae normal.     Pupils: Pupils are equal, round, and reactive to light.  Neck:     Thyroid: No thyroid mass, thyromegaly or thyroid tenderness.    Cardiovascular:     Rate and Rhythm: Normal rate and regular rhythm.     Pulses: Normal pulses.     Heart sounds: Normal heart sounds, S1 normal and S2 normal.  Pulmonary:     Effort: Pulmonary effort is normal.     Breath sounds: Normal breath sounds.  Chest:     Chest wall: No deformity.  Abdominal:     General: Abdomen is flat. Bowel sounds are normal. There is no abdominal bruit.     Palpations: Abdomen is soft. There is no hepatomegaly or splenomegaly.     Comments: Obese abdomine  Musculoskeletal:        General: Normal range of motion.     Cervical back: Full passive range of motion without pain, normal range of motion and neck supple.     Right lower leg: No edema.     Left lower leg: No edema.  Lymphadenopathy:     Head:     Right side of head: No submental, submandibular, tonsillar, preauricular, posterior auricular or occipital adenopathy.     Left side of head: No submental, submandibular, tonsillar, preauricular, posterior auricular or occipital adenopathy.     Cervical: No cervical adenopathy.  Skin:    General: Skin is warm and dry.     Capillary Refill: Capillary refill takes less than 2 seconds.  Neurological:     General: No focal deficit present.     Mental Status: He is alert and oriented to person, place, and time.     Cranial Nerves: Cranial nerves are intact.     Deep Tendon Reflexes: Reflexes are normal and symmetric.  Psychiatric:        Attention and Perception: Attention and perception normal.        Mood and Affect: Mood and affect normal.        Speech: Speech normal.        Behavior: Behavior normal. Behavior is cooperative.        Thought Content: Thought content normal.        Cognition and Memory: Cognition and memory normal.        Judgment: Judgment normal.     Assessment & Plan:  Establish care Follow up ape with one week prior labs bp check at home report >140/90 Pending lab results will call-lab draw today.  Problem List Items  Addressed This Visit      Cardiovascular and Mediastinum   Essential hypertension - Primary  Relevant Medications   aspirin EC 81 MG tablet   Other Relevant Orders   CBC with Differential/Platelet   COMPLETE METABOLIC PANEL WITH GFR   Lipid panel   PSA     Other   Hyperlipidemia   Relevant Medications   aspirin EC 81 MG tablet   Other Relevant Orders   CBC with Differential/Platelet   COMPLETE METABOLIC PANEL WITH GFR   Lipid panel   PSA    Other Visit Diagnoses    Prostate cancer screening       Relevant Orders   CBC with Differential/Platelet   COMPLETE METABOLIC PANEL WITH GFR   Lipid panel   PSA     Follow-up: Return in about 6 months (around 05/06/2020) for ape with labs one week prior.   Annie Main, FNP

## 2019-11-08 ENCOUNTER — Ambulatory Visit: Payer: No Typology Code available for payment source | Attending: Internal Medicine

## 2019-11-08 DIAGNOSIS — Z23 Encounter for immunization: Secondary | ICD-10-CM

## 2019-11-08 NOTE — Progress Notes (Signed)
   Covid-19 Vaccination Clinic  Name:  Jose Austin    MRN: [id number] DOB: 19-Sep-1960  11/08/2019  Jose Austin was observed post Covid-19 immunization for 15 minutes without incident. He was provided with Vaccine Information Sheet and instruction to access the V-Safe system.   Jose Austin was instructed to call 911 with any severe reactions post vaccine: Marland Kitchen Difficulty breathing  . Swelling of face and throat  . A fast heartbeat  . A bad rash all over body  . Dizziness and weakness   Immunizations Administered    Name Date Dose VIS Date Route   Pfizer COVID-19 Vaccine 11/08/2019  2:47 PM 0.3 mL 07/23/2019 Intramuscular   Manufacturer: [employer], [employer]   Lot: [id number]   NDC: [address]-6

## 2019-11-08 NOTE — Progress Notes (Signed)
CBC normal, glucose elevated, cholesterol and triglycerides slightly elevated watch fats, cholesterol in diet and get exercise to help reduce, this will also help sugar, low carb low sugar diet, PSA (prostate ) normal. Follow up as planned.

## 2019-12-01 ENCOUNTER — Ambulatory Visit: Payer: No Typology Code available for payment source | Attending: Internal Medicine

## 2019-12-01 DIAGNOSIS — Z23 Encounter for immunization: Secondary | ICD-10-CM

## 2019-12-01 NOTE — Progress Notes (Signed)
   Covid-19 Vaccination Clinic  Name:  Jose Austin    MRN: [id number] DOB: 21-Mar-1961  12/01/2019  Mr. [name] was observed post Covid-19 immunization for 15 minutes without incident. He was provided with Vaccine Information Sheet and instruction to access the V-Safe system.   Mr. [name] was instructed to call 911 with any severe reactions post vaccine: Marland Kitchen Difficulty breathing  . Swelling of face and throat  . A fast heartbeat  . A bad rash all over body  . Dizziness and weakness   Immunizations Administered    Name Date Dose VIS Date Route   Pfizer COVID-19 Vaccine 12/01/2019 10:02 AM 0.3 mL 10/06/2018 Intramuscular   Manufacturer: [employer], [employer]   Lot: [id number]   NDC: [address]-3

## 2019-12-13 ENCOUNTER — Telehealth: Payer: Self-pay | Admitting: Nurse Practitioner

## 2019-12-13 NOTE — Telephone Encounter (Signed)
CB# [phone] Refill on Losartan

## 2019-12-15 ENCOUNTER — Other Ambulatory Visit: Payer: Self-pay

## 2019-12-15 MED ORDER — LOSARTAN POTASSIUM 25 MG PO TABS: 25.0000 mg | ORAL_TABLET | Freq: Every day | ORAL | 3 refills | Status: DC

## 2020-05-05 ENCOUNTER — Other Ambulatory Visit: Payer: No Typology Code available for payment source

## 2020-05-05 DIAGNOSIS — Z20822 Contact with and (suspected) exposure to covid-19: Secondary | ICD-10-CM

## 2020-05-06 LAB — NOVEL CORONAVIRUS, NAA: SARS-CoV-2, NAA: NOT DETECTED

## 2020-05-06 LAB — SARS-COV-2, NAA 2 DAY TAT

## 2020-05-08 ENCOUNTER — Other Ambulatory Visit: Payer: No Typology Code available for payment source

## 2020-05-08 ENCOUNTER — Other Ambulatory Visit: Payer: Self-pay

## 2020-05-08 DIAGNOSIS — I10 Essential (primary) hypertension: Secondary | ICD-10-CM

## 2020-05-08 DIAGNOSIS — Z1159 Encounter for screening for other viral diseases: Secondary | ICD-10-CM

## 2020-05-08 DIAGNOSIS — E785 Hyperlipidemia, unspecified: Secondary | ICD-10-CM

## 2020-05-09 LAB — CBC WITH DIFFERENTIAL/PLATELET
Absolute Monocytes: 703 cells/uL (ref 200–950)
Basophils Absolute: 107 cells/uL (ref 0–200)
Basophils Relative: 1.2 %
Eosinophils Absolute: 196 cells/uL (ref 15–500)
Eosinophils Relative: 2.2 %
HCT: 49.1 % (ref 38.5–50.0)
Hemoglobin: 16.5 g/dL (ref 13.2–17.1)
Lymphs Abs: 3631 cells/uL (ref 850–3900)
MCH: 32.9 pg (ref 27.0–33.0)
MCHC: 33.6 g/dL (ref 32.0–36.0)
MCV: 97.8 fL (ref 80.0–100.0)
MPV: 10.5 fL (ref 7.5–12.5)
Monocytes Relative: 7.9 %
Neutro Abs: 4263 cells/uL (ref 1500–7800)
Neutrophils Relative %: 47.9 %
Platelets: 275 10*3/uL (ref 140–400)
RBC: 5.02 10*6/uL (ref 4.20–5.80)
RDW: 12.8 % (ref 11.0–15.0)
Total Lymphocyte: 40.8 %
WBC: 8.9 10*3/uL (ref 3.8–10.8)

## 2020-05-09 LAB — COMPLETE METABOLIC PANEL WITH GFR
AG Ratio: 1.8 (calc) (ref 1.0–2.5)
ALT: 16 U/L (ref 9–46)
AST: 21 U/L (ref 10–35)
Albumin: 4.5 g/dL (ref 3.6–5.1)
Alkaline phosphatase (APISO): 81 U/L (ref 35–144)
BUN: 16 mg/dL (ref 7–25)
CO2: 24 mmol/L (ref 20–32)
Calcium: 9.6 mg/dL (ref 8.6–10.3)
Chloride: 104 mmol/L (ref 98–110)
Creat: 1.02 mg/dL (ref 0.70–1.25)
GFR, Est African American: 92 mL/min/{1.73_m2} (ref >=60)
GFR, Est Non African American: 80 mL/min/{1.73_m2} (ref >=60)
Globulin: 2.5 g/dL (calc) (ref 1.9–3.7)
Glucose, Bld: 98 mg/dL (ref 65–99)
Potassium: 4.3 mmol/L (ref 3.5–5.3)
Sodium: 140 mmol/L (ref 135–146)
Total Bilirubin: 0.5 mg/dL (ref 0.2–1.2)
Total Protein: 7 g/dL (ref 6.1–8.1)

## 2020-05-09 LAB — LIPID PANEL
Cholesterol: 251 mg/dL — ABNORMAL HIGH (ref <200)
HDL: 58 mg/dL (ref >=40)
LDL Cholesterol (Calc): 152 mg/dL (calc) — ABNORMAL HIGH
Non-HDL Cholesterol (Calc): 193 mg/dL (calc) — ABNORMAL HIGH (ref <130)
Total CHOL/HDL Ratio: 4.3 (calc) (ref <5.0)
Triglycerides: 242 mg/dL — ABNORMAL HIGH (ref <150)

## 2020-05-09 LAB — HEPATITIS C ANTIBODY
Hepatitis C Ab: NONREACTIVE
SIGNAL TO CUT-OFF: 0 (ref <1.00)

## 2020-05-16 ENCOUNTER — Encounter: Payer: Self-pay | Admitting: Family Medicine

## 2020-05-16 ENCOUNTER — Other Ambulatory Visit: Payer: Self-pay

## 2020-05-16 ENCOUNTER — Ambulatory Visit: Payer: No Typology Code available for payment source | Admitting: Nurse Practitioner

## 2020-05-16 ENCOUNTER — Ambulatory Visit (INDEPENDENT_AMBULATORY_CARE_PROVIDER_SITE_OTHER): Payer: No Typology Code available for payment source | Admitting: Family Medicine

## 2020-05-16 VITALS — BP 120/88 | HR 82 | Temp 98.2°F | Ht 72.0 in | Wt 290.0 lb

## 2020-05-16 DIAGNOSIS — Z23 Encounter for immunization: Secondary | ICD-10-CM | POA: Diagnosis not present

## 2020-05-16 DIAGNOSIS — E785 Hyperlipidemia, unspecified: Secondary | ICD-10-CM | POA: Diagnosis not present

## 2020-05-16 DIAGNOSIS — I10 Essential (primary) hypertension: Secondary | ICD-10-CM | POA: Diagnosis not present

## 2020-05-16 MED ORDER — LOSARTAN POTASSIUM 25 MG PO TABS: 25.0000 mg | ORAL_TABLET | Freq: Every day | ORAL | 3 refills | Status: DC

## 2020-05-16 MED ORDER — ROSUVASTATIN CALCIUM 10 MG PO TABS: 10.0000 mg | ORAL_TABLET | Freq: Every day | ORAL | 3 refills | Status: DC

## 2020-05-16 NOTE — Progress Notes (Signed)
Subjective:    Patient ID: Jose Austin, male    DOB: Jan 22, 1961, 59 y.o.   MRN: [id number]  HPI Patient is a very pleasant 59 year old Caucasian male here today to establish care with me.  He has been previously seeing my partner.  He is currently on losartan.  His blood pressure is well controlled today at 120/88 but he does have a history of hypertension.  He also clearly has hyperlipidemia on his most recent lab work with an LDL cholesterol greater than 150.  His BMI is near 40 at 39.33. Lab on 05/08/2020  Component Date Value Ref Range Status  . WBC 05/08/2020 8.9  3.8 - 10.8 Thousand/uL Final  . RBC 05/08/2020 5.02  4.20 - 5.80 Million/uL Final  . Hemoglobin 05/08/2020 16.5  13.2 - 17.1 g/dL Final  . HCT 08/65/7846 49.1  38 - 50 % Final  . MCV 05/08/2020 97.8  80.0 - 100.0 fL Final  . MCH 05/08/2020 32.9  27.0 - 33.0 pg Final  . MCHC 05/08/2020 33.6  32.0 - 36.0 g/dL Final  . RDW 96/29/5284 12.8  11.0 - 15.0 % Final  . Platelets 05/08/2020 275  140 - 400 Thousand/uL Final  . MPV 05/08/2020 10.5  7.5 - 12.5 fL Final  . Neutro Abs 05/08/2020 4,263  1,500 - 7,800 cells/uL Final  . Lymphs Abs 05/08/2020 3,631  850 - 3,900 cells/uL Final  . Absolute Monocytes 05/08/2020 703  200 - 950 cells/uL Final  . Eosinophils Absolute 05/08/2020 196  15 - 500 cells/uL Final  . Basophils Absolute 05/08/2020 107  0 - 200 cells/uL Final  . Neutrophils Relative % 05/08/2020 47.9  % Final  . Total Lymphocyte 05/08/2020 40.8  % Final  . Monocytes Relative 05/08/2020 7.9  % Final  . Eosinophils Relative 05/08/2020 2.2  % Final  . Basophils Relative 05/08/2020 1.2  % Final  . Glucose, Bld 05/08/2020 98  65 - 99 mg/dL Final   Comment: .            Fasting reference interval .   . BUN 05/08/2020 16  7 - 25 mg/dL Final  . Creat 13/24/4010 1.02  0.70 - 1.25 mg/dL Final   Comment: For patients >51 years of age, the reference limit for Creatinine is approximately 13% higher for people identified as  African-American. .   . GFR, Est Non African American 05/08/2020 80  > OR = 60 mL/min/1.36m2 Final  . GFR, Est African American 05/08/2020 92  > OR = 60 mL/min/1.79m2 Final  . BUN/Creatinine Ratio 05/08/2020 NOT APPLICABLE  6 - 22 (calc) Final  . Sodium 05/08/2020 140  135 - 146 mmol/L Final  . Potassium 05/08/2020 4.3  3.5 - 5.3 mmol/L Final  . Chloride 05/08/2020 104  98 - 110 mmol/L Final  . CO2 05/08/2020 24  20 - 32 mmol/L Final  . Calcium 05/08/2020 9.6  8.6 - 10.3 mg/dL Final  . Total Protein 05/08/2020 7.0  6.1 - 8.1 g/dL Final  . Albumin 27/25/3664 4.5  3.6 - 5.1 g/dL Final  . Globulin 40/34/7425 2.5  1.9 - 3.7 g/dL (calc) Final  . AG Ratio 05/08/2020 1.8  1.0 - 2.5 (calc) Final  . Total Bilirubin 05/08/2020 0.5  0.2 - 1.2 mg/dL Final  . Alkaline phosphatase (APISO) 05/08/2020 81  35 - 144 U/L Final  . AST 05/08/2020 21  10 - 35 U/L Final  . ALT 05/08/2020 16  9 - 46 U/L Final  . Cholesterol  05/08/2020 251* <200 mg/dL Final  . HDL 41/93/7902 58  > OR = 40 mg/dL Final  . Triglycerides 05/08/2020 242* <150 mg/dL Final   Comment: . If a non-fasting specimen was collected, consider repeat triglyceride testing on a fasting specimen if clinically indicated.  Perry Mount et al. J. of Clin. Lipidol. 2015;9:129-169. .   . LDL Cholesterol (Calc) 05/08/2020 152* mg/dL (calc) Final   Comment: Reference range: <100 . Desirable range <100 mg/dL for primary prevention;   <70 mg/dL for patients with CHD or diabetic patients  with > or = 2 CHD risk factors. Marland Kitchen LDL-C is now calculated using the Martin-Hopkins  calculation, which is a validated novel method providing  better accuracy than the Friedewald equation in the  estimation of LDL-C.  Horald Pollen et al. Lenox Ahr. 4097;353(29): 2061-2068  (http://education.[facility].com/faq/FAQ164)   . Total CHOL/HDL Ratio 05/08/2020 4.3  <9.2 (calc) Final  . Non-HDL Cholesterol (Calc) 05/08/2020 193* <130 mg/dL (calc) Final   Comment: For  patients with diabetes plus 1 major ASCVD risk  factor, treating to a non-HDL-C goal of <100 mg/dL  (LDL-C of <42 mg/dL) is considered a therapeutic  option.   . Hepatitis C Ab 05/08/2020 NON-REACTIVE  NON-REACTI Final  . SIGNAL TO CUT-OFF 05/08/2020 0.00  <1.00 Final   Comment: . HCV antibody was non-reactive. There is no laboratory  evidence of HCV infection. . In most cases, no further action is required. However, if recent HCV exposure is suspected, a test for HCV RNA (test code 68341) is suggested. . For additional information please refer to http://education.[facility].com/faq/FAQ22v1 (This link is being provided for informational/ educational purposes only.) .   Lab on 05/05/2020  Component Date Value Ref Range Status  . SARS-CoV-2, NAA 05/05/2020 Not Detected  Not Detected Final   Comment: This nucleic acid amplification test was developed and its performance characteristics determined by [employer]. Nucleic acid amplification tests include RT-PCR and TMA. This test has not been FDA cleared or approved. This test has been authorized by FDA under an Emergency Use Authorization (EUA). This test is only authorized for the duration of time the declaration that circumstances exist justifying the authorization of the emergency use of in vitro diagnostic tests for detection of SARS-CoV-2 virus and/or diagnosis of COVID-19 infection under section 564(b)(1) of the Act, 21 U.S.C. 962IWL-7(L) (1), unless the authorization is terminated or revoked sooner. When diagnostic testing is negative, the possibility of a false negative result should be considered in the context of a patient's recent exposures and the presence of clinical signs and symptoms consistent with COVID-19. An individual without symptoms of COVID-19 and who is not shedding SARS-CoV-2 virus wo                          uld expect to have a negative (not detected) result in this assay.   Marland Kitchen  SARS-CoV-2, NAA 2 DAY TAT 05/05/2020 Performed   Final   Therefore the patient qualifies for morbid obesity given to medical problems related to his obesity.  He is not exercising regularly.  He is not monitoring his calorie intake. Past Medical History:  Diagnosis Date  . Anxiety   . Atypical chest pain   . ED (erectile dysfunction)   . GERD (gastroesophageal reflux disease)   . Hyperlipidemia   . Hypertension    Past Surgical History:  Procedure Laterality Date  . CHOLECYSTECTOMY N/A 10/25/2014   Procedure: LAPAROSCOPIC CHOLECYSTECTOMY;  Surgeon: Abigail Miyamoto, MD;  Location:  [facility] OR;  Service: General;  Laterality: N/A;  . HERNIA REPAIR     Current Outpatient Medications on File Prior to Visit  Medication Sig Dispense Refill  . aspirin EC 81 MG tablet Take 81 mg by mouth daily.    . sildenafil (VIAGRA) 50 MG tablet Take 50 mg by mouth as needed for erectile dysfunction.     No current facility-administered medications on file prior to visit.   No Known Allergies Social History   Socioeconomic History  . Marital status: Single    Spouse name: Not on file  . Number of children: Not on file  . Years of education: Not on file  . Highest education level: Not on file  Occupational History  . Occupation: [employer]    Comment: works 14 hours a day  Tobacco Use  . Smoking status: Former Smoker    Quit date: 08/12/1998    Years since quitting: 21.7  . Smokeless tobacco: Never Used  Substance and Sexual Activity  . Alcohol use: Never    Alcohol/week: 0.0 standard drinks  . Drug use: Never  . Sexual activity: Yes  Other Topics Concern  . Not on file  Social History Narrative   Drinks 2-3 sodas daily.   Social Determinants of Health   Financial Resource Strain:   . Difficulty of Paying Living Expenses: Not on file  Food Insecurity:   . Worried About Programme researcher, broadcasting/film/video in the Last Year: Not on file  . Ran Out of Food in the Last Year: Not on file  Transportation  Needs:   . Lack of Transportation (Medical): Not on file  . Lack of Transportation (Non-Medical): Not on file  Physical Activity:   . Days of Exercise per Week: Not on file  . Minutes of Exercise per Session: Not on file  Stress:   . Feeling of Stress : Not on file  Social Connections:   . Frequency of Communication with Friends and Family: Not on file  . Frequency of Social Gatherings with Friends and Family: Not on file  . Attends Religious Services: Not on file  . Active Member of Clubs or Organizations: Not on file  . Attends Banker Meetings: Not on file  . Marital Status: Not on file  Intimate Partner Violence:   . Fear of Current or Ex-Partner: Not on file  . Emotionally Abused: Not on file  . Physically Abused: Not on file  . Sexually Abused: Not on file      Review of Systems  All other systems reviewed and are negative.      Objective:   Physical Exam Vitals reviewed.  Constitutional:      General: He is not in acute distress.    Appearance: He is obese. He is not ill-appearing or toxic-appearing.  Cardiovascular:     Rate and Rhythm: Normal rate and regular rhythm.     Heart sounds: Normal heart sounds. No murmur heard.  No friction rub. No gallop.   Pulmonary:     Effort: Pulmonary effort is normal. No respiratory distress.     Breath sounds: Normal breath sounds. No wheezing, rhonchi or rales.  Musculoskeletal:     Right lower leg: No edema.     Left lower leg: No edema.  Neurological:     Mental Status: He is alert.           Assessment & Plan:  Need for immunization against influenza - Plan: Flu Vaccine QUAD 36+ mos IM  Essential hypertension  Hyperlipidemia, unspecified hyperlipidemia type  Morbid obesity due to excess calories (HCC)  Blood pressure today is well controlled.  Patient received his flu shot.  Recommended starting Crestor 10 mg a day and then rechecking fasting lipid panel and CMP in 3 months.  Encouraged in  aspirin 81 mg daily.  Recommended 30 minutes a day 5 days a week of aerobic exercise.  Recommended he start gradually with walking and slowly uptitrate the time and intensity to her ultimate goal.  Would like to see the patient try to lose 20 to 30 pounds over the next 6 months.  Also recommended dietary changes to help facilitate weight loss.

## 2020-07-10 ENCOUNTER — Telehealth: Payer: Self-pay

## 2020-07-10 ENCOUNTER — Other Ambulatory Visit: Payer: Self-pay

## 2020-07-10 ENCOUNTER — Telehealth: Payer: Self-pay | Admitting: Family Medicine

## 2020-07-10 MED ORDER — LOSARTAN POTASSIUM 25 MG PO TABS: 12.5000 mg | ORAL_TABLET | Freq: Every day | ORAL | 3 refills | Status: DC

## 2020-07-10 NOTE — Telephone Encounter (Signed)
Has question concerning his Losartan the dosage pharmacy refill 100-12.5 mg wasn't sure is this same as Losartan 25 mg

## 2020-07-10 NOTE — Telephone Encounter (Signed)
Pt would like to know if he could be prescribed something other that Rosuvastatin, at this moment it's too expensive for him.

## 2020-07-10 NOTE — Telephone Encounter (Signed)
Rx sent to Pt pharmacy

## 2020-07-10 NOTE — Telephone Encounter (Signed)
Mr. Norgaard has been taking 12.5 mg Losartan, but pharmacy keeps sending him in 100 mg, he would like to stay at 12.5 mg

## 2020-07-11 ENCOUNTER — Other Ambulatory Visit: Payer: Self-pay | Admitting: Family Medicine

## 2020-07-11 MED ORDER — LOSARTAN POTASSIUM 25 MG PO TABS: 12.5000 mg | ORAL_TABLET | Freq: Every day | ORAL | 3 refills | Status: DC

## 2020-07-11 MED ORDER — ATORVASTATIN CALCIUM 20 MG PO TABS: 20.0000 mg | ORAL_TABLET | Freq: Every day | ORAL | 3 refills | Status: DC

## 2020-07-11 NOTE — Telephone Encounter (Signed)
Patient should be on losartan 25 mg (1/2 tablet per day).

## 2020-07-11 NOTE — Telephone Encounter (Signed)
I will switch him to lipitor 20 mg poqday.

## 2020-07-11 NOTE — Telephone Encounter (Signed)
Provider sent pt rx to pharmacy 

## 2020-07-11 NOTE — Telephone Encounter (Signed)
Called to inform pharmacy as well as Pt

## 2021-04-08 ENCOUNTER — Other Ambulatory Visit: Payer: Self-pay | Admitting: Family Medicine

## 2021-08-28 ENCOUNTER — Other Ambulatory Visit: Payer: Self-pay

## 2021-08-28 ENCOUNTER — Ambulatory Visit (INDEPENDENT_AMBULATORY_CARE_PROVIDER_SITE_OTHER): Payer: BC Managed Care – PPO

## 2021-08-28 DIAGNOSIS — I10 Essential (primary) hypertension: Secondary | ICD-10-CM

## 2021-08-28 NOTE — Progress Notes (Signed)
Pt presented for BP check. Pt states he has been concerned it has been running high. He reports frequent headaches. He states he has been taking his medications regularly. He denies chest pain, shob or other symptoms.  Appt scheduled with Dr. Tanya Nones for 08/31/20. Pt advised to seek UC or ED evaluation if he has chest pain, shob or other cardiac symptoms.

## 2021-08-31 ENCOUNTER — Encounter: Payer: Self-pay | Admitting: Family Medicine

## 2021-08-31 ENCOUNTER — Ambulatory Visit (INDEPENDENT_AMBULATORY_CARE_PROVIDER_SITE_OTHER): Payer: BC Managed Care – PPO | Admitting: Family Medicine

## 2021-08-31 ENCOUNTER — Other Ambulatory Visit: Payer: Self-pay

## 2021-08-31 VITALS — BP 138/82 | HR 60 | Temp 96.8°F | Resp 18 | Ht 72.0 in | Wt 280.0 lb

## 2021-08-31 DIAGNOSIS — I10 Essential (primary) hypertension: Secondary | ICD-10-CM

## 2021-08-31 DIAGNOSIS — R0789 Other chest pain: Secondary | ICD-10-CM

## 2021-08-31 DIAGNOSIS — E785 Hyperlipidemia, unspecified: Secondary | ICD-10-CM

## 2021-08-31 NOTE — Progress Notes (Signed)
Subjective:    Patient ID: Jose Austin, male    DOB: 14-Jun-1961, 61 y.o.   MRN: [id number]  HPI   Patient is a very pleasant 61 year old Caucasian gentleman who presents today with atypical chest pain.  He states that recently he has been getting more easily out of breath.  He will break out sweating all over his forehead for no reason.  He can feel his heart pounding in his neck and in his head.  His blood pressure has been higher in the [id number] systolic.  He is also been getting atypical chest pain.  He states that he will develop a tightness in his left shoulder that will radiate into his left chest.  Patient is sedentary but he has not seen any association of this pain with exercise.  He does occasionally get on the treadmill and he has noticed no chest pain or pressure with exercise.  The tightness in his chest will occur randomly unrelated to activity.  However he does find himself more easily out of breath which she associates with deconditioning.  His EKG today shows normal sinus rhythm with normal intervals and normal axis with no evidence of ischemia or infarction Past Medical History:  Diagnosis Date   Anxiety    Atypical chest pain    ED (erectile dysfunction)    GERD (gastroesophageal reflux disease)    Hyperlipidemia    Hypertension    Past Surgical History:  Procedure Laterality Date   CHOLECYSTECTOMY N/A 10/25/2014   Procedure: LAPAROSCOPIC CHOLECYSTECTOMY;  Surgeon: Coralie Keens, MD;  Location: [facility];  Service: General;  Laterality: N/A;   HERNIA REPAIR     Current Outpatient Medications on File Prior to Visit  Medication Sig Dispense Refill   aspirin EC 81 MG tablet Take 81 mg by mouth daily.     atorvastatin (LIPITOR) 20 MG tablet TAKE 1 TABLET BY MOUTH EVERY DAY 90 tablet 3   losartan (COZAAR) 25 MG tablet Take 0.5 tablets (12.5 mg total) by mouth daily. 90 tablet 3   sildenafil (VIAGRA) 100 MG tablet Take by mouth.     No current facility-administered  medications on file prior to visit.   No Known Allergies  Social History   Socioeconomic History   Marital status: Single    Spouse name: Not on file   Number of children: Not on file   Years of education: Not on file   Highest education level: Not on file  Occupational History   Occupation: [employer]    Comment: works 14 hours a day  Tobacco Use   Smoking status: Former    Types: Cigarettes    Quit date: 08/12/1998    Years since quitting: 23.0   Smokeless tobacco: Never  Substance and Sexual Activity   Alcohol use: Never    Alcohol/week: 0.0 standard drinks   Drug use: Never   Sexual activity: Yes  Other Topics Concern   Not on file  Social History Narrative   Drinks 2-3 sodas daily.   Social Determinants of Health   Financial Resource Strain: Not on file  Food Insecurity: Not on file  Transportation Needs: Not on file  Physical Activity: Not on file  Stress: Not on file  Social Connections: Not on file  Intimate Partner Violence: Not on file    Review of Systems  All other systems reviewed and are negative.     Objective:   Physical Exam Vitals reviewed.  Constitutional:      Appearance: Normal appearance.  He is obese.  Cardiovascular:     Rate and Rhythm: Normal rate and regular rhythm.     Heart sounds: Normal heart sounds. No murmur heard.   No gallop.  Pulmonary:     Effort: Pulmonary effort is normal.     Breath sounds: Normal breath sounds.  Abdominal:     General: Bowel sounds are normal. There is no distension.     Palpations: Abdomen is soft.     Tenderness: There is no abdominal tenderness.  Musculoskeletal:     Right lower leg: No edema.     Left lower leg: No edema.  Neurological:     Mental Status: He is alert.          Assessment & Plan:  Atypical chest pain - Plan: EKG 12-Lead, CBC with Differential/Platelet, Lipid panel, COMPLETE METABOLIC PANEL WITH GFR  Morbid obesity due to excess calories (HCC)  Essential  hypertension  Hyperlipidemia, unspecified hyperlipidemia type I believe the atypical chest pain and diaphoresis is likely musculoskeletal and may be even anxiety related.  However the patient has several risk factors including age, hypertension, hyperlipidemia, and obesity.  I believe an outpatient stress test is warranted and I will consult cardiology regarding this.  EKG is reassuring.  Increase losartan 1 full tablet daily try to get a systolic blood pressure under 140.  Continue Lipitor.  Check CMP and lipid panel.  Goal LDL cholesterol less than 100.  Once stress test hopefully return to normal, I will clear the patient for vigorous aerobic exercise in an attempt to try to lose 25 to 50 pounds over the next 4 months

## 2021-09-01 LAB — COMPLETE METABOLIC PANEL WITH GFR
AG Ratio: 1.7 (calc) (ref 1.0–2.5)
ALT: 18 U/L (ref 9–46)
AST: 23 U/L (ref 10–35)
Albumin: 4.5 g/dL (ref 3.6–5.1)
Alkaline phosphatase (APISO): 103 U/L (ref 35–144)
BUN: 14 mg/dL (ref 7–25)
CO2: 28 mmol/L (ref 20–32)
Calcium: 9.7 mg/dL (ref 8.6–10.3)
Chloride: 108 mmol/L (ref 98–110)
Creat: 0.99 mg/dL (ref 0.70–1.35)
Globulin: 2.6 g/dL (calc) (ref 1.9–3.7)
Glucose, Bld: 83 mg/dL (ref 65–99)
Potassium: 4.3 mmol/L (ref 3.5–5.3)
Sodium: 145 mmol/L (ref 135–146)
Total Bilirubin: 0.8 mg/dL (ref 0.2–1.2)
Total Protein: 7.1 g/dL (ref 6.1–8.1)
eGFR: 87 mL/min/{1.73_m2} (ref >=60)

## 2021-09-01 LAB — CBC WITH DIFFERENTIAL/PLATELET
Absolute Monocytes: 585 cells/uL (ref 200–950)
Basophils Absolute: 87 cells/uL (ref 0–200)
Basophils Relative: 1.1 %
Eosinophils Absolute: 63 cells/uL (ref 15–500)
Eosinophils Relative: 0.8 %
HCT: 48.6 % (ref 38.5–50.0)
Hemoglobin: 16.3 g/dL (ref 13.2–17.1)
Lymphs Abs: 3334 cells/uL (ref 850–3900)
MCH: 32.3 pg (ref 27.0–33.0)
MCHC: 33.5 g/dL (ref 32.0–36.0)
MCV: 96.4 fL (ref 80.0–100.0)
MPV: 10.4 fL (ref 7.5–12.5)
Monocytes Relative: 7.4 %
Neutro Abs: 3832 cells/uL (ref 1500–7800)
Neutrophils Relative %: 48.5 %
Platelets: 235 10*3/uL (ref 140–400)
RBC: 5.04 10*6/uL (ref 4.20–5.80)
RDW: 12.4 % (ref 11.0–15.0)
Total Lymphocyte: 42.2 %
WBC: 7.9 10*3/uL (ref 3.8–10.8)

## 2021-09-01 LAB — LIPID PANEL
Cholesterol: 137 mg/dL (ref <200)
HDL: 52 mg/dL (ref >=40)
LDL Cholesterol (Calc): 67 mg/dL (calc)
Non-HDL Cholesterol (Calc): 85 mg/dL (calc) (ref <130)
Total CHOL/HDL Ratio: 2.6 (calc) (ref <5.0)
Triglycerides: 97 mg/dL (ref <150)

## 2021-09-14 ENCOUNTER — Other Ambulatory Visit: Payer: Self-pay | Admitting: Family Medicine

## 2021-11-07 ENCOUNTER — Other Ambulatory Visit: Payer: Self-pay | Admitting: Family Medicine

## 2021-11-08 ENCOUNTER — Other Ambulatory Visit: Payer: Self-pay

## 2021-11-08 MED ORDER — LOSARTAN POTASSIUM 25 MG PO TABS
25.0000 mg | ORAL_TABLET | Freq: Every day | ORAL | 1 refills | Status: DC
Start: 2021-11-08 — End: 2022-05-09

## 2022-02-11 ENCOUNTER — Telehealth: Payer: Self-pay

## 2022-02-11 NOTE — Telephone Encounter (Signed)
Pt LM for return called 02/08/22  02/11/22 try calling pt back and no answer. LM

## 2022-04-17 ENCOUNTER — Other Ambulatory Visit: Payer: Self-pay

## 2022-04-17 NOTE — Telephone Encounter (Signed)
Pharmacy faxed a refill request for atorvastatin (LIPITOR) 20 MG tablet [[id number]]    Order Details Dose, Route, Frequency: As Directed  Dispense Quantity: 90 tablet Refills: 3        Sig: TAKE 1 TABLET BY MOUTH EVERY DAY       Start Date: 04/09/21 End Date: --  Written Date: 04/09/21 Expiration Date: 04/09/22  Original Order:  atorvastatin (LIPITOR) 20 MG tablet [[id number]]

## 2022-04-18 MED ORDER — ATORVASTATIN CALCIUM 20 MG PO TABS: 20.0000 mg | ORAL_TABLET | Freq: Every day | ORAL | 0 refills | Status: DC

## 2022-04-18 NOTE — Telephone Encounter (Signed)
Requested Prescriptions  Pending Prescriptions Disp Refills  . atorvastatin (LIPITOR) 20 MG tablet 90 tablet 0    Sig: Take 1 tablet (20 mg total) by mouth daily.     Cardiovascular:  Antilipid - Statins Failed - 04/17/2022 11:53 AM      Failed - Lipid Panel in normal range within the last 12 months    Cholesterol  Date Value Ref Range Status  08/31/2021 137 <200 mg/dL Final   LDL Cholesterol (Calc)  Date Value Ref Range Status  08/31/2021 67 mg/dL (calc) Final    Comment:    Reference range: <100 . Desirable range <100 mg/dL for primary prevention;   <70 mg/dL for patients with CHD or diabetic patients  with > or = 2 CHD risk factors. Marland Kitchen LDL-C is now calculated using the Martin-Hopkins  calculation, which is a validated novel method providing  better accuracy than the Friedewald equation in the  estimation of LDL-C.  Horald Pollen et al. Lenox Ahr. 9373;428(76): 2061-2068  (http://education.[facility].com/faq/FAQ164)    HDL  Date Value Ref Range Status  08/31/2021 52 > OR = 40 mg/dL Final   Triglycerides  Date Value Ref Range Status  08/31/2021 97 <150 mg/dL Final         Passed - Patient is not pregnant      Passed - Valid encounter within last 12 months    Recent Outpatient Visits          7 months ago Atypical chest pain   [facility] Pickard, Priscille Heidelberg, MD   1 year ago Need for immunization against influenza   [facility] Pickard, Priscille Heidelberg, MD   2 years ago Essential hypertension   [facility] Elmore Guise, FNP

## 2022-05-09 ENCOUNTER — Other Ambulatory Visit: Payer: Self-pay | Admitting: Family Medicine

## 2022-05-09 ENCOUNTER — Other Ambulatory Visit: Payer: Self-pay

## 2022-05-09 DIAGNOSIS — E785 Hyperlipidemia, unspecified: Secondary | ICD-10-CM

## 2022-05-09 MED ORDER — ATORVASTATIN CALCIUM 20 MG PO TABS: 20.0000 mg | ORAL_TABLET | Freq: Every day | ORAL | 3 refills | Status: DC

## 2022-07-19 ENCOUNTER — Other Ambulatory Visit: Payer: Self-pay | Admitting: Family Medicine

## 2022-07-19 DIAGNOSIS — E785 Hyperlipidemia, unspecified: Secondary | ICD-10-CM

## 2022-08-22 ENCOUNTER — Other Ambulatory Visit: Payer: Self-pay | Admitting: Family Medicine

## 2022-08-22 ENCOUNTER — Telehealth: Payer: Self-pay | Admitting: Family Medicine

## 2022-08-22 ENCOUNTER — Other Ambulatory Visit: Payer: Self-pay

## 2022-08-22 DIAGNOSIS — U071 COVID-19: Secondary | ICD-10-CM

## 2022-08-22 MED ORDER — NIRMATRELVIR/RITONAVIR (PAXLOVID)TABLET: 3.0000 | ORAL_TABLET | Freq: Two times a day (BID) | ORAL | 0 refills | Status: DC

## 2022-08-22 NOTE — Telephone Encounter (Signed)
Patient called to report he tested positive for COVID today. Sx began yesterday: headache, sinus congestion, fatigue, heavy feeling in chest (mucous), body aches.   Requesting for provider to call in script to his pharmacy.  [facility] [address] (pharmacy listed as [place] but they're out of stock).  Please advise at [phone]

## 2022-08-23 ENCOUNTER — Other Ambulatory Visit: Payer: Self-pay | Admitting: Family Medicine

## 2022-08-23 DIAGNOSIS — U071 COVID-19: Secondary | ICD-10-CM

## 2022-08-23 MED ORDER — NIRMATRELVIR/RITONAVIR (PAXLOVID)TABLET: 3.0000 | ORAL_TABLET | Freq: Two times a day (BID) | ORAL | 0 refills | Status: AC

## 2022-08-23 NOTE — Telephone Encounter (Signed)
[facility] pharmacy called to request clarification of the paxlovid script; unsure if we want patient to have standard dose or renal dose? Thank you!

## 2022-08-23 NOTE — Telephone Encounter (Signed)
[facility] pharmacist left a voicemail message to request clarification of paxlovid; unsure if provider wants patient to have standard dose or renal dose.   Please advise at [phone].

## 2022-11-06 ENCOUNTER — Other Ambulatory Visit: Payer: Self-pay | Admitting: Family Medicine

## 2022-11-06 NOTE — Telephone Encounter (Signed)
Requested Prescriptions  Pending Prescriptions Disp Refills   losartan (COZAAR) 25 MG tablet [Pharmacy Med Name: LOSARTAN 25MG  TABLETS] 90 tablet 0    Sig: TAKE 1 TABLET(25 MG) BY MOUTH DAILY     Cardiovascular:  Angiotensin Receptor Blockers Failed - 11/06/2022  3:14 AM      Failed - Cr in normal range and within 180 days    Creat  Date Value Ref Range Status  08/31/2021 0.99 0.70 - 1.35 mg/dL Final         Failed - K in normal range and within 180 days    Potassium  Date Value Ref Range Status  08/31/2021 4.3 3.5 - 5.3 mmol/L Final         Failed - Valid encounter within last 6 months    Recent Outpatient Visits           1 year ago Atypical chest pain   [place] Susy Frizzle, MD   2 years ago Need for immunization against influenza   [place], Cammie Mcgee, MD   3 years ago Essential hypertension   [place] [place], [place]              Passed - Patient is not pregnant      Passed - Last BP in normal range    BP Readings from Last 1 Encounters:  08/31/21 138/82

## 2023-02-06 ENCOUNTER — Other Ambulatory Visit: Payer: Self-pay | Admitting: Family Medicine

## 2023-02-07 ENCOUNTER — Other Ambulatory Visit: Payer: Self-pay | Admitting: Family Medicine

## 2023-02-07 NOTE — Telephone Encounter (Signed)
Patient needs OV for additional refills, will refill for 30 days until OV can be made.  Requested Prescriptions  Pending Prescriptions Disp Refills   losartan (COZAAR) 25 MG tablet [Pharmacy Med Name: LOSARTAN 25MG  TABLETS] 30 tablet 0    Sig: TAKE 1 TABLET(25 MG) BY MOUTH DAILY     Cardiovascular:  Angiotensin Receptor Blockers Failed - 02/06/2023  3:12 AM      Failed - Cr in normal range and within 180 days    Creat  Date Value Ref Range Status  08/31/2021 0.99 0.70 - 1.35 mg/dL Final         Failed - K in normal range and within 180 days    Potassium  Date Value Ref Range Status  08/31/2021 4.3 3.5 - 5.3 mmol/L Final         Failed - Valid encounter within last 6 months    Recent Outpatient Visits           1 year ago Atypical chest pain   [facility] Donita Brooks, MD   2 years ago Need for immunization against influenza   [facility] Tanya Nones, Priscille Heidelberg, MD   3 years ago Essential hypertension   [facility] Elmore Guise, [place]              Passed - Patient is not pregnant      Passed - Last BP in normal range    BP Readings from Last 1 Encounters:  08/31/21 138/82

## 2023-02-10 NOTE — Telephone Encounter (Signed)
Requested by interface surescripts. Courtesy refill #30 given. Future visit in 1 week.  Requested Prescriptions  Refused Prescriptions Disp Refills   losartan (COZAAR) 25 MG tablet [Pharmacy Med Name: LOSARTAN 25MG  TABLETS] 90 tablet     Sig: TAKE 1 TABLET(25 MG) BY MOUTH DAILY     Cardiovascular:  Angiotensin Receptor Blockers Failed - 02/10/2023 10:56 AM      Failed - Cr in normal range and within 180 days    Creat  Date Value Ref Range Status  08/31/2021 0.99 0.70 - 1.35 mg/dL Final         Failed - K in normal range and within 180 days    Potassium  Date Value Ref Range Status  08/31/2021 4.3 3.5 - 5.3 mmol/L Final         Failed - Valid encounter within last 6 months    Recent Outpatient Visits           1 year ago Atypical chest pain   [facility] Donita Brooks, MD   2 years ago Need for immunization against influenza   [facility] Tanya Nones, Priscille Heidelberg, MD   3 years ago Essential hypertension   [facility] Elmore Guise, FNP       Future Appointments             In 1 week Tanya Nones, Priscille Heidelberg, MD [facility], [facility]            Passed - Patient is not pregnant      Passed - Last BP in normal range    BP Readings from Last 1 Encounters:  08/31/21 138/82

## 2023-02-17 ENCOUNTER — Ambulatory Visit (INDEPENDENT_AMBULATORY_CARE_PROVIDER_SITE_OTHER): Payer: BLUE CROSS/BLUE SHIELD | Admitting: Family Medicine

## 2023-02-17 ENCOUNTER — Encounter: Payer: Self-pay | Admitting: Family Medicine

## 2023-02-17 VITALS — BP 140/82 | HR 65 | Temp 97.9°F | Ht 72.0 in | Wt 289.0 lb

## 2023-02-17 DIAGNOSIS — Z148 Genetic carrier of other disease: Secondary | ICD-10-CM | POA: Diagnosis not present

## 2023-02-17 DIAGNOSIS — I10 Essential (primary) hypertension: Secondary | ICD-10-CM

## 2023-02-17 DIAGNOSIS — E785 Hyperlipidemia, unspecified: Secondary | ICD-10-CM

## 2023-02-17 DIAGNOSIS — Z125 Encounter for screening for malignant neoplasm of prostate: Secondary | ICD-10-CM | POA: Diagnosis not present

## 2023-02-17 LAB — CBC WITH DIFFERENTIAL/PLATELET
Hemoglobin: 16.4 g/dL (ref 13.2–17.1)
MCV: 96.1 fL (ref 80.0–100.0)
MPV: 10.8 fL (ref 7.5–12.5)
Neutrophils Relative %: 52.3 %
RDW: 12.5 % (ref 11.0–15.0)
Total Lymphocyte: 39.3 %
WBC: 7.9 10*3/uL (ref 3.8–10.8)

## 2023-02-17 LAB — COMPLETE METABOLIC PANEL WITH GFR
Albumin: 4.7 g/dL (ref 3.6–5.1)
Calcium: 9.7 mg/dL (ref 8.6–10.3)
Globulin: 2.6 g/dL (calc) (ref 1.9–3.7)
Potassium: 4.5 mmol/L (ref 3.5–5.3)
Total Protein: 7.3 g/dL (ref 6.1–8.1)

## 2023-02-17 LAB — LIPID PANEL
Cholesterol: 164 mg/dL (ref <200)
Total CHOL/HDL Ratio: 2.8 (calc) (ref <5.0)

## 2023-02-17 MED ORDER — LOSARTAN POTASSIUM 25 MG PO TABS
ORAL_TABLET | ORAL | 1 refills | Status: DC
Start: 2023-02-17 — End: 2023-03-13

## 2023-02-17 MED ORDER — ATORVASTATIN CALCIUM 20 MG PO TABS
ORAL_TABLET | ORAL | 1 refills | Status: DC
Start: 2023-02-17 — End: 2023-07-21

## 2023-02-17 NOTE — Progress Notes (Signed)
Subjective:    Patient ID: Jose Austin, male    DOB: [birth year], 62 y.o.   MRN: [id number]  HPI  Patient has a history of hypertension and hyperlipidemia.  His blood pressure at home is typically 130/80 per his report.  He denies any chest pain shortness of breath or dyspnea on exertion.  He is due to recheck fasting lab work to monitor his cholesterol.  He is due to screen for prostate cancer with a PSA.  He does not recall the last time he had a colonoscopy however he states that it was done at [facility].  He is not sure when he needs his next colonoscopy.  He also has a family history of hemochromatosis.  His father had hemochromatosis.  A paternal cousin also had hemochromatosis.   Past Medical History:  Diagnosis Date   Anxiety    Atypical chest pain    ED (erectile dysfunction)    GERD (gastroesophageal reflux disease)    Hyperlipidemia    Hypertension    Past Surgical History:  Procedure Laterality Date   CHOLECYSTECTOMY N/A 10/25/2014   Procedure: LAPAROSCOPIC CHOLECYSTECTOMY;  Surgeon: Abigail Miyamoto, MD;  Location[facility];  Service: General;  Laterality: N/A;   HERNIA REPAIR     Current Outpatient Medications on File Prior to Visit  Medication Sig Dispense Refill   sildenafil (VIAGRA) 100 MG tablet Take by mouth.     aspirin EC 81 MG tablet Take 81 mg by mouth daily. (Patient not taking: Reported on 02/17/2023)     No current facility-administered medications on file prior to visit.   No Known Allergies  Social History   Socioeconomic History   Marital status: Single    Spouse name: Not on file   Number of children: Not on file   Years of education: Not on file   Highest education level: Not on file  Occupational History   Occupation: [employer]    Comment: works 14 hours a day  Tobacco Use   Smoking status: Former    Types: Cigarettes    Quit date: 08/12/1998    Years since quitting: 24.5   Smokeless tobacco: Never  Substance and Sexual Activity   Alcohol  use: Never    Alcohol/week: 0.0 standard drinks of alcohol   Drug use: Never   Sexual activity: Yes  Other Topics Concern   Not on file  Social History Narrative   Drinks 2-3 sodas daily.   Social Determinants of Health   Financial Resource Strain: Not on file  Food Insecurity: Not on file  Transportation Needs: Not on file  Physical Activity: Not on file  Stress: Not on file  Social Connections: Not on file  Intimate Partner Violence: Not on file    Review of Systems  All other systems reviewed and are negative.      Objective:   Physical Exam Vitals reviewed.  Constitutional:      Appearance: Normal appearance. He is obese.  Cardiovascular:     Rate and Rhythm: Normal rate and regular rhythm.     Heart sounds: Normal heart sounds. No murmur heard.    No gallop.  Pulmonary:     Effort: Pulmonary effort is normal.     Breath sounds: Normal breath sounds.  Abdominal:     General: Bowel sounds are normal. There is no distension.     Palpations: Abdomen is soft.     Tenderness: There is no abdominal tenderness.  Musculoskeletal:     Right  lower leg: No edema.     Left lower leg: No edema.  Neurological:     Mental Status: He is alert.           Assessment & Plan:  Essential hypertension - Plan: losartan (COZAAR) 25 MG tablet, CBC with Differential/Platelet, Lipid panel, COMPLETE METABOLIC PANEL WITH GFR, PSA  Hyperlipidemia, unspecified hyperlipidemia type - Plan: atorvastatin (LIPITOR) 20 MG tablet, CBC with Differential/Platelet, Lipid panel, COMPLETE METABOLIC PANEL WITH GFR, PSA  Prostate cancer screening - Plan: PSA  Hemochromatosis carrier - Plan: CBC with Differential/Platelet, Lipid panel, COMPLETE METABOLIC PANEL WITH GFR, PSA, Ferritin, Transferrin Home blood pressure is acceptable.  I will screen for prostate cancer with a PSA.  I will have my nurse contact his gastroenterologist to determine when he is due for his next colonoscopy.  I will check a  CBC a CMP and a lipid panel.  I like to see his LDL cholesterol below 100.  Given his family history of hemochromatosis, I will screen initially with ferritin and transferrin saturation studies.  If both are elevated, would recommend further genetic testing.

## 2023-02-18 LAB — COMPLETE METABOLIC PANEL WITH GFR
AG Ratio: 1.8 (calc) (ref 1.0–2.5)
ALT: 12 U/L (ref 9–46)
AST: 17 U/L (ref 10–35)
Alkaline phosphatase (APISO): 111 U/L (ref 35–144)
BUN: 12 mg/dL (ref 7–25)
CO2: 27 mmol/L (ref 20–32)
Chloride: 107 mmol/L (ref 98–110)
Creat: 0.98 mg/dL (ref 0.70–1.35)
Glucose, Bld: 99 mg/dL (ref 65–99)
Sodium: 142 mmol/L (ref 135–146)
Total Bilirubin: 0.6 mg/dL (ref 0.2–1.2)
eGFR: 87 mL/min/{1.73_m2} (ref >=60)

## 2023-02-18 LAB — CBC WITH DIFFERENTIAL/PLATELET
Absolute Monocytes: 529 cells/uL (ref 200–950)
Basophils Absolute: 71 cells/uL (ref 0–200)
Basophils Relative: 0.9 %
Eosinophils Absolute: 63 cells/uL (ref 15–500)
Eosinophils Relative: 0.8 %
HCT: 48.9 % (ref 38.5–50.0)
Lymphs Abs: 3105 cells/uL (ref 850–3900)
MCH: 32.2 pg (ref 27.0–33.0)
MCHC: 33.5 g/dL (ref 32.0–36.0)
Monocytes Relative: 6.7 %
Neutro Abs: 4132 cells/uL (ref 1500–7800)
Platelets: 254 10*3/uL (ref 140–400)
RBC: 5.09 10*6/uL (ref 4.20–5.80)

## 2023-02-18 LAB — LIPID PANEL
HDL: 59 mg/dL (ref >=40)
LDL Cholesterol (Calc): 79 mg/dL (calc)
Non-HDL Cholesterol (Calc): 105 mg/dL (calc) (ref <130)
Triglycerides: 164 mg/dL — ABNORMAL HIGH (ref <150)

## 2023-02-18 LAB — PSA: PSA: 0.43 ng/mL (ref <=4.00)

## 2023-02-18 LAB — TRANSFERRIN: Transferrin: 237 mg/dL (ref 188–341)

## 2023-02-18 LAB — FERRITIN: Ferritin: 162 ng/mL (ref 24–380)

## 2023-03-09 ENCOUNTER — Other Ambulatory Visit: Payer: Self-pay | Admitting: Family Medicine

## 2023-03-09 DIAGNOSIS — I10 Essential (primary) hypertension: Secondary | ICD-10-CM

## 2023-03-12 ENCOUNTER — Telehealth: Payer: Self-pay | Admitting: Family Medicine

## 2023-03-12 NOTE — Telephone Encounter (Signed)
Patient left voicemail to follow up on refill requested for  losartan (COZAAR) 25 MG tablet [[id number]]   Patient stated in message pharmacy won't give refill unless provider calls in to approve it.  Please advise pharmacist.   [facility] [phone] - [place], [place] - [address] [facility] [address] [place], [place] [address] Phone: [phone]  Fax: [phone] DEA #: [id number]

## 2023-03-13 ENCOUNTER — Other Ambulatory Visit: Payer: Self-pay

## 2023-03-13 DIAGNOSIS — I10 Essential (primary) hypertension: Secondary | ICD-10-CM

## 2023-03-13 MED ORDER — LOSARTAN POTASSIUM 25 MG PO TABS
ORAL_TABLET | ORAL | 1 refills | Status: DC
Start: 2023-03-13 — End: 2023-09-12

## 2023-03-13 NOTE — Telephone Encounter (Signed)
Patient requesting a 90 day supply. Please see previous message in thread.

## 2023-04-23 DIAGNOSIS — R1084 Generalized abdominal pain: Secondary | ICD-10-CM | POA: Diagnosis not present

## 2023-04-23 DIAGNOSIS — K5730 Diverticulosis of large intestine without perforation or abscess without bleeding: Secondary | ICD-10-CM | POA: Diagnosis not present

## 2023-04-23 DIAGNOSIS — R3121 Asymptomatic microscopic hematuria: Secondary | ICD-10-CM | POA: Diagnosis not present

## 2023-04-23 DIAGNOSIS — N201 Calculus of ureter: Secondary | ICD-10-CM | POA: Diagnosis not present

## 2023-04-23 DIAGNOSIS — R109 Unspecified abdominal pain: Secondary | ICD-10-CM | POA: Diagnosis not present

## 2023-04-23 DIAGNOSIS — N132 Hydronephrosis with renal and ureteral calculous obstruction: Secondary | ICD-10-CM | POA: Diagnosis not present

## 2023-04-23 DIAGNOSIS — R3129 Other microscopic hematuria: Secondary | ICD-10-CM | POA: Diagnosis not present

## 2023-07-19 ENCOUNTER — Other Ambulatory Visit: Payer: Self-pay | Admitting: Family Medicine

## 2023-07-19 DIAGNOSIS — E785 Hyperlipidemia, unspecified: Secondary | ICD-10-CM

## 2023-09-12 ENCOUNTER — Other Ambulatory Visit: Payer: Self-pay | Admitting: Family Medicine

## 2023-09-12 DIAGNOSIS — I10 Essential (primary) hypertension: Secondary | ICD-10-CM

## 2023-12-11 DIAGNOSIS — C44319 Basal cell carcinoma of skin of other parts of face: Secondary | ICD-10-CM | POA: Diagnosis not present

## 2023-12-11 DIAGNOSIS — D492 Neoplasm of unspecified behavior of bone, soft tissue, and skin: Secondary | ICD-10-CM | POA: Diagnosis not present

## 2023-12-11 DIAGNOSIS — L281 Prurigo nodularis: Secondary | ICD-10-CM | POA: Diagnosis not present

## 2023-12-26 ENCOUNTER — Other Ambulatory Visit: Payer: Self-pay | Admitting: Family Medicine

## 2023-12-26 DIAGNOSIS — E785 Hyperlipidemia, unspecified: Secondary | ICD-10-CM

## 2023-12-29 NOTE — Telephone Encounter (Signed)
 Patient will need an office visit for further refills. Requested Prescriptions  Pending Prescriptions Disp Refills   atorvastatin  (LIPITOR) 20 MG tablet [Pharmacy Med Name: ATORVASTATIN  20MG  TABLETS] 90 tablet 0    Sig: TAKE 1 TABLET(20 MG) BY MOUTH DAILY     Cardiovascular:  Antilipid - Statins Failed - 12/29/2023  2:50 PM      Failed - Lipid Panel in normal range within the last 12 months    Cholesterol  Date Value Ref Range Status  02/17/2023 164 <200 mg/dL Final   LDL Cholesterol (Calc)  Date Value Ref Range Status  02/17/2023 79 mg/dL (calc) Final    Comment:    Reference range: <100 . Desirable range <100 mg/dL for primary prevention;   <70 mg/dL for patients with CHD or diabetic patients  with > or = 2 CHD risk factors. [name] LDL-C is now calculated using the Martin-Hopkins  calculation, which is a validated novel method providing  better accuracy than the Friedewald equation in the  estimation of LDL-C.  [name] et al. [name]. 6045;409(81): 2061-2068  (http://education.[facility].com/faq/FAQ164)    HDL  Date Value Ref Range Status  02/17/2023 59 > OR = 40 mg/dL Final   Triglycerides  Date Value Ref Range Status  02/17/2023 164 (H) <150 mg/dL Final         Passed - Patient is not pregnant      Passed - Valid encounter within last 12 months    Recent Outpatient Visits           10 months ago Essential hypertension   [name] [facility] Pickard, Cisco Crest, MD

## 2024-01-21 DIAGNOSIS — C44319 Basal cell carcinoma of skin of other parts of face: Secondary | ICD-10-CM | POA: Diagnosis not present

## 2024-02-06 DIAGNOSIS — Z48817 Encounter for surgical aftercare following surgery on the skin and subcutaneous tissue: Secondary | ICD-10-CM | POA: Diagnosis not present

## 2024-02-20 DIAGNOSIS — Z48817 Encounter for surgical aftercare following surgery on the skin and subcutaneous tissue: Secondary | ICD-10-CM | POA: Diagnosis not present

## 2024-03-13 ENCOUNTER — Other Ambulatory Visit: Payer: Self-pay | Admitting: Family Medicine

## 2024-03-13 DIAGNOSIS — I10 Essential (primary) hypertension: Secondary | ICD-10-CM

## 2024-03-29 ENCOUNTER — Other Ambulatory Visit: Payer: Self-pay | Admitting: Family Medicine

## 2024-03-29 DIAGNOSIS — E785 Hyperlipidemia, unspecified: Secondary | ICD-10-CM

## 2024-03-31 NOTE — Telephone Encounter (Signed)
 Received second refill request from pharmacy for atorvastatin  (LIPITOR) 20 MG tablet   Left message on patient's voicemail to schedule med refill office visit. Requested call back.

## 2024-03-31 NOTE — Telephone Encounter (Signed)
 Needs appt

## 2024-03-31 NOTE — Telephone Encounter (Signed)
 Attempted to contact patient x 2 on # [phone] to schedule appt for medication refills and annual exam. Last OV 02/17/23. No answer and attempted to leave message to call back and call disconnected.

## 2024-03-31 NOTE — Telephone Encounter (Signed)
 Requested medication (s) are due for refill today: yes   Requested medication (s) are on the active medication list: yes   Last refill:  12/29/23 #90 0 refills  Future visit scheduled: no   Notes to clinic:  needs appt for further refills. Last OV 02/17/23. Attempted to contact patient [phone] x 2 and no answer, attempted to leave message and call disconnected. Please advise.      Requested Prescriptions  Pending Prescriptions Disp Refills   atorvastatin  (LIPITOR) 20 MG tablet [Pharmacy Med Name: ATORVASTATIN  20MG  TABLETS] 90 tablet 0    Sig: TAKE 1 TABLET(20 MG) BY MOUTH DAILY     Cardiovascular:  Antilipid - Statins Failed - 03/31/2024  1:17 PM      Failed - Valid encounter within last 12 months    Recent Outpatient Visits           1 year ago Essential hypertension   [facility] Duanne Butler DASEN, MD              Failed - Lipid Panel in normal range within the last 12 months    Cholesterol  Date Value Ref Range Status  02/17/2023 164 <200 mg/dL Final   LDL Cholesterol (Calc)  Date Value Ref Range Status  02/17/2023 79 mg/dL (calc) Final    Comment:    Reference range: <100 . Desirable range <100 mg/dL for primary prevention;   <70 mg/dL for patients with CHD or diabetic patients  with > or = 2 CHD risk factors. SABRA LDL-C is now calculated using the Martin-Hopkins  calculation, which is a validated novel method providing  better accuracy than the Friedewald equation in the  estimation of LDL-C.  Gladis APPLETHWAITE et al. SANDREA. 7986;689(80): 2061-2068  (http://education.[facility].com/faq/FAQ164)    HDL  Date Value Ref Range Status  02/17/2023 59 > OR = 40 mg/dL Final   Triglycerides  Date Value Ref Range Status  02/17/2023 164 (H) <150 mg/dL Final         Passed - Patient is not pregnant

## 2024-04-02 ENCOUNTER — Other Ambulatory Visit: Payer: Self-pay | Admitting: Family Medicine

## 2024-04-02 DIAGNOSIS — E785 Hyperlipidemia, unspecified: Secondary | ICD-10-CM

## 2024-04-02 NOTE — Telephone Encounter (Signed)
 Copied from CRM #[id number]. Topic: Clinical - Medication Refill >> Apr 02, 2024 11:18 AM Tysheama G wrote: Medication: atorvastatin  (LIPITOR) 20 MG tablet  Has the patient contacted their pharmacy? Yes (Agent: If no, request that the patient contact the pharmacy for the refill. If patient does not wish to contact the pharmacy document the reason why and proceed with request.) (Agent: If yes, when and what did the pharmacy advise?)  This is the patient's preferred pharmacy:  [id number] - [place], [place] - [address] [place] [address] Phone: [phone] Fax: [phone]  [id number] [place], [place] - [address] [place] [address] Phone: [phone] Fax: [phone]  Is this the correct pharmacy for this prescription? Yes If no, delete pharmacy and type the correct one.   Has the prescription been filled recently? No  Is the patient out of the medication? Yes  Has the patient been seen for an appointment in the last year OR does the patient have an upcoming appointment? No  Can we respond through MyChart? Yes  Agent: Please be advised that Rx refills may take up to 3 business days. We ask that you follow-up with your pharmacy.

## 2024-04-02 NOTE — Telephone Encounter (Signed)
 Copied from CRM #[id number]. Topic: Clinical - Medication Refill >> Apr 02, 2024 11:18 AM Jose Austin wrote: Medication: atorvastatin  (LIPITOR) 20 MG tablet  Has the patient contacted their pharmacy? Yes (Agent: If no, request that the patient contact the pharmacy for the refill. If patient does not wish to contact the pharmacy document the reason why and proceed with request.) (Agent: If yes, when and what did the pharmacy advise?)  This is the patient's preferred pharmacy:  [id number] - [place], [place] - [address] [place] [address] Phone: [phone] Fax: [phone]  [id number] [place], [place] - [address] [place] [address] Phone: [phone] Fax: [phone]  Is this the correct pharmacy for this prescription? Yes If no, delete pharmacy and type the correct one.   Has the prescription been filled recently? No  Is the patient out of the medication? Yes  Has the patient been seen for an appointment in the last year OR does the patient have an upcoming appointment? No  Can we respond through MyChart? Yes  Agent: Please be advised that Rx refills may take up to 3 business days. We ask that you follow-up with your pharmacy.

## 2024-04-05 NOTE — Telephone Encounter (Signed)
 Requested medications are due for refill today.  yes  Requested medications are on the active medications list.  yes  Last refill. 12/29/2023 #90 0 rf  Future visit scheduled.   yes  Notes to clinic.  Labs are expired.    Requested Prescriptions  Pending Prescriptions Disp Refills   atorvastatin  (LIPITOR) 20 MG tablet 90 tablet 0    Sig: TAKE 1 TABLET(20 MG) BY MOUTH DAILY     Cardiovascular:  Antilipid - Statins Failed - 04/05/2024 10:20 AM      Failed - Valid encounter within last 12 months    Recent Outpatient Visits           1 year ago Essential hypertension   [facility] Duanne Butler DASEN, MD              Failed - Lipid Panel in normal range within the last 12 months    Cholesterol  Date Value Ref Range Status  02/17/2023 164 <200 mg/dL Final   LDL Cholesterol (Calc)  Date Value Ref Range Status  02/17/2023 79 mg/dL (calc) Final    Comment:    Reference range: <100 . Desirable range <100 mg/dL for primary prevention;   <70 mg/dL for patients with CHD or diabetic patients  with > or = 2 CHD risk factors. SABRA LDL-C is now calculated using the Martin-Hopkins  calculation, which is a validated novel method providing  better accuracy than the Friedewald equation in the  estimation of LDL-C.  Gladis APPLETHWAITE et al. SANDREA. 7986;689(80): 2061-2068  (http://education.[facility].com/faq/FAQ164)    HDL  Date Value Ref Range Status  02/17/2023 59 > OR = 40 mg/dL Final   Triglycerides  Date Value Ref Range Status  02/17/2023 164 (H) <150 mg/dL Final         Passed - Patient is not pregnant

## 2024-04-08 ENCOUNTER — Encounter: Payer: Self-pay | Admitting: Family Medicine

## 2024-04-08 ENCOUNTER — Ambulatory Visit: Attending: Family Medicine

## 2024-04-08 ENCOUNTER — Ambulatory Visit: Admitting: Family Medicine

## 2024-04-08 VITALS — BP 136/82 | HR 65 | Temp 98.5°F | Ht 72.0 in | Wt 284.4 lb

## 2024-04-08 DIAGNOSIS — R42 Dizziness and giddiness: Secondary | ICD-10-CM

## 2024-04-08 DIAGNOSIS — Z125 Encounter for screening for malignant neoplasm of prostate: Secondary | ICD-10-CM | POA: Diagnosis not present

## 2024-04-08 DIAGNOSIS — Z1211 Encounter for screening for malignant neoplasm of colon: Secondary | ICD-10-CM

## 2024-04-08 DIAGNOSIS — I10 Essential (primary) hypertension: Secondary | ICD-10-CM

## 2024-04-08 DIAGNOSIS — E785 Hyperlipidemia, unspecified: Secondary | ICD-10-CM

## 2024-04-08 LAB — CBC WITH DIFFERENTIAL/PLATELET
Absolute Lymphocytes: 3315 {cells}/uL (ref 850–3900)
Absolute Monocytes: 713 {cells}/uL (ref 200–950)
Basophils Absolute: 78 {cells}/uL (ref 0–200)
Basophils Relative: 0.9 %
Eosinophils Absolute: 61 {cells}/uL (ref 15–500)
Eosinophils Relative: 0.7 %
HCT: 50.2 % — ABNORMAL HIGH (ref 38.5–50.0)
Hemoglobin: 16.7 g/dL (ref 13.2–17.1)
MCH: 32.9 pg (ref 27.0–33.0)
MCHC: 33.3 g/dL (ref 32.0–36.0)
MCV: 98.8 fL (ref 80.0–100.0)
MPV: 10.2 fL (ref 7.5–12.5)
Monocytes Relative: 8.2 %
Neutro Abs: 4533 {cells}/uL (ref 1500–7800)
Neutrophils Relative %: 52.1 %
Platelets: 269 Thousand/uL (ref 140–400)
RBC: 5.08 Million/uL (ref 4.20–5.80)
RDW: 12.6 % (ref 11.0–15.0)
Total Lymphocyte: 38.1 %
WBC: 8.7 Thousand/uL (ref 3.8–10.8)

## 2024-04-08 LAB — COMPREHENSIVE METABOLIC PANEL WITH GFR
AG Ratio: 1.8 (calc) (ref 1.0–2.5)
ALT: 14 U/L (ref 9–46)
AST: 20 U/L (ref 10–35)
Albumin: 4.6 g/dL (ref 3.6–5.1)
Alkaline phosphatase (APISO): 101 U/L (ref 35–144)
BUN: 16 mg/dL (ref 7–25)
CO2: 28 mmol/L (ref 20–32)
Calcium: 9.8 mg/dL (ref 8.6–10.3)
Chloride: 107 mmol/L (ref 98–110)
Creat: 1.02 mg/dL (ref 0.70–1.35)
Globulin: 2.6 g/dL (ref 1.9–3.7)
Glucose, Bld: 91 mg/dL (ref 65–99)
Potassium: 4.3 mmol/L (ref 3.5–5.3)
Sodium: 143 mmol/L (ref 135–146)
Total Bilirubin: 0.6 mg/dL (ref 0.2–1.2)
Total Protein: 7.2 g/dL (ref 6.1–8.1)
eGFR: 82 mL/min/1.73m2 (ref >=60)

## 2024-04-08 LAB — LIPID PANEL
Cholesterol: 164 mg/dL (ref <200)
HDL: 49 mg/dL (ref >=40)
LDL Cholesterol (Calc): 89 mg/dL
Non-HDL Cholesterol (Calc): 115 mg/dL (ref <130)
Total CHOL/HDL Ratio: 3.3 (calc) (ref <5.0)
Triglycerides: 159 mg/dL — ABNORMAL HIGH (ref <150)

## 2024-04-08 LAB — PSA: PSA: 0.5 ng/mL (ref <=4.00)

## 2024-04-08 NOTE — Progress Notes (Unsigned)
 EP to read.

## 2024-04-08 NOTE — Progress Notes (Signed)
 Subjective:    Patient ID: Jose Austin, male    DOB: [birth year], 63 y.o.   MRN: [id number]  HPI  Patient has a history of hypertension and hyperlipidemia.  Recently he has been having episodes where he suddenly feels lightheaded.  He feels flushed.  He denies any chest pain.  This typically occurs at work.  He states that he can feel like there is a change in his pulse.  He is overdue for colon cancer screening.  He is due for prostate cancer screening.  He is also due for a pneumonia vaccine and a shingles vaccine. Past Medical History:  Diagnosis Date   Anxiety    Atypical chest pain    ED (erectile dysfunction)    GERD (gastroesophageal reflux disease)    Hyperlipidemia    Hypertension    Past Surgical History:  Procedure Laterality Date   CHOLECYSTECTOMY N/A 10/25/2014   Procedure: LAPAROSCOPIC CHOLECYSTECTOMY;  Surgeon: Vicenta Poli, MD;  Location: [facility] OR;  Service: General;  Laterality: N/A;   HERNIA REPAIR     Current Outpatient Medications on File Prior to Visit  Medication Sig Dispense Refill   aspirin EC 81 MG tablet Take 81 mg by mouth daily. (Patient taking differently: Take 81 mg by mouth daily. Takes every other day.)     atorvastatin  (LIPITOR) 20 MG tablet TAKE 1 TABLET(20 MG) BY MOUTH DAILY 90 tablet 0   losartan  (COZAAR ) 25 MG tablet TAKE 1 TABLET(25 MG) BY MOUTH DAILY 90 tablet 1   sildenafil (VIAGRA) 100 MG tablet Take by mouth.     No current facility-administered medications on file prior to visit.   No Known Allergies  Social History   Socioeconomic History   Marital status: Single    Spouse name: Not on file   Number of children: Not on file   Years of education: Not on file   Highest education level: Not on file  Occupational History   Occupation: [employer]    Comment: works 14 hours a day  Tobacco Use   Smoking status: Former    Current packs/day: 0.00    Types: Cigarettes    Quit date: 08/12/1998    Years since quitting: 25.6    Smokeless tobacco: Never  Substance and Sexual Activity   Alcohol use: Never    Alcohol/week: 0.0 standard drinks of alcohol   Drug use: Never   Sexual activity: Yes  Other Topics Concern   Not on file  Social History Narrative   Drinks 2-3 sodas daily.   Social Drivers of Corporate investment banker Strain: Not on file  Food Insecurity: Not on file  Transportation Needs: Not on file  Physical Activity: Not on file  Stress: Not on file  Social Connections: Not on file  Intimate Partner Violence: Not on file    Review of Systems  All other systems reviewed and are negative.      Objective:   Physical Exam Vitals reviewed.  Constitutional:      Appearance: Normal appearance. He is obese.  Cardiovascular:     Rate and Rhythm: Normal rate and regular rhythm.     Heart sounds: Normal heart sounds. No murmur heard.    No gallop.  Pulmonary:     Effort: Pulmonary effort is normal.     Breath sounds: Normal breath sounds.  Abdominal:     General: Bowel sounds are normal. There is no distension.     Palpations: Abdomen is soft.  Tenderness: There is no abdominal tenderness.  Musculoskeletal:     Right lower leg: No edema.     Left lower leg: No edema.  Neurological:     Mental Status: He is alert.           Assessment & Plan:  Prostate cancer screening - Plan: PSA  Essential hypertension - Plan: CBC with Differential/Platelet, Comprehensive metabolic panel with GFR, Lipid panel  Hyperlipidemia, unspecified hyperlipidemia type  Colon cancer screening - Plan: Cologuard  Lightheadedness - Plan: LONG TERM MONITOR XT (3-14 DAYS) I will schedule the patient for a cardiac monitor to rule out cardiac arrhythmias such as bradycardia as a cause of the lightheadedness.  I recommended the patient check his blood pressure and his pulse when this occurs.  Screen for colon cancer with a Cologuard.  Screening for prostate cancer with a PSA.  Check CBC CMP and a lipid panel.   Offered a pneumonia vaccine and a shingles vaccine which he declined today

## 2024-04-09 ENCOUNTER — Ambulatory Visit: Payer: Self-pay | Admitting: Family Medicine

## 2024-04-17 DIAGNOSIS — Z1211 Encounter for screening for malignant neoplasm of colon: Secondary | ICD-10-CM | POA: Diagnosis not present

## 2024-04-22 LAB — COLOGUARD: COLOGUARD: NEGATIVE

## 2024-05-03 DIAGNOSIS — R42 Dizziness and giddiness: Secondary | ICD-10-CM | POA: Diagnosis not present

## 2024-05-04 DIAGNOSIS — R42 Dizziness and giddiness: Secondary | ICD-10-CM | POA: Diagnosis not present

## 2024-05-31 ENCOUNTER — Encounter: Admitting: Family Medicine

## 2024-06-11 DIAGNOSIS — H25812 Combined forms of age-related cataract, left eye: Secondary | ICD-10-CM | POA: Diagnosis not present

## 2024-07-05 ENCOUNTER — Other Ambulatory Visit: Payer: Self-pay | Admitting: Family Medicine

## 2024-07-05 DIAGNOSIS — E785 Hyperlipidemia, unspecified: Secondary | ICD-10-CM

## 2024-07-14 DIAGNOSIS — H25812 Combined forms of age-related cataract, left eye: Secondary | ICD-10-CM | POA: Diagnosis not present

## 2024-09-03 ENCOUNTER — Encounter: Payer: Self-pay | Admitting: Family Medicine

## 2024-09-03 ENCOUNTER — Ambulatory Visit (INDEPENDENT_AMBULATORY_CARE_PROVIDER_SITE_OTHER): Admitting: Family Medicine

## 2024-09-03 VITALS — BP 122/80 | HR 75 | Temp 97.8°F | Ht 72.0 in | Wt 273.0 lb

## 2024-09-03 DIAGNOSIS — Z0001 Encounter for general adult medical examination with abnormal findings: Secondary | ICD-10-CM | POA: Diagnosis not present

## 2024-09-03 DIAGNOSIS — I10 Essential (primary) hypertension: Secondary | ICD-10-CM | POA: Diagnosis not present

## 2024-09-03 DIAGNOSIS — Z0000 Encounter for general adult medical examination without abnormal findings: Secondary | ICD-10-CM

## 2024-09-03 DIAGNOSIS — E785 Hyperlipidemia, unspecified: Secondary | ICD-10-CM | POA: Diagnosis not present

## 2024-09-03 LAB — CBC WITH DIFFERENTIAL/PLATELET
Absolute Lymphocytes: 3222 {cells}/uL (ref 850–3900)
Absolute Monocytes: 561 {cells}/uL (ref 200–950)
Basophils Absolute: 94 {cells}/uL (ref 0–200)
Basophils Relative: 1.1 %
Eosinophils Absolute: 102 {cells}/uL (ref 15–500)
Eosinophils Relative: 1.2 %
HCT: 45.5 % (ref 39.4–51.1)
Hemoglobin: 15.9 g/dL (ref 13.2–17.1)
MCH: 32.9 pg (ref 27.0–33.0)
MCHC: 34.9 g/dL (ref 31.6–35.4)
MCV: 94.2 fL (ref 81.4–101.7)
MPV: 9.8 fL (ref 7.5–12.5)
Monocytes Relative: 6.6 %
Neutro Abs: 4522 {cells}/uL (ref 1500–7800)
Neutrophils Relative %: 53.2 %
Platelets: 325 Thousand/uL (ref 140–400)
RBC: 4.83 Million/uL (ref 4.20–5.80)
RDW: 12.3 % (ref 11.0–15.0)
Total Lymphocyte: 37.9 %
WBC: 8.5 Thousand/uL (ref 3.8–10.8)

## 2024-09-03 LAB — COMPREHENSIVE METABOLIC PANEL WITH GFR
AG Ratio: 1.5 (calc) (ref 1.0–2.5)
ALT: 14 U/L (ref 9–46)
AST: 20 U/L (ref 10–35)
Albumin: 4.3 g/dL (ref 3.6–5.1)
Alkaline phosphatase (APISO): 96 U/L (ref 35–144)
BUN: 13 mg/dL (ref 7–25)
CO2: 28 mmol/L (ref 20–32)
Calcium: 9.4 mg/dL (ref 8.6–10.3)
Chloride: 105 mmol/L (ref 98–110)
Creat: 0.83 mg/dL (ref 0.70–1.35)
Globulin: 2.8 g/dL (ref 1.9–3.7)
Glucose, Bld: 87 mg/dL (ref 65–99)
Potassium: 4.3 mmol/L (ref 3.5–5.3)
Sodium: 142 mmol/L (ref 135–146)
Total Bilirubin: 0.7 mg/dL (ref 0.2–1.2)
Total Protein: 7.1 g/dL (ref 6.1–8.1)
eGFR: 98 mL/min/1.73m2

## 2024-09-03 LAB — LIPID PANEL
Cholesterol: 138 mg/dL
HDL: 49 mg/dL
LDL Cholesterol (Calc): 67 mg/dL
Non-HDL Cholesterol (Calc): 89 mg/dL
Total CHOL/HDL Ratio: 2.8 (calc)
Triglycerides: 137 mg/dL

## 2024-09-03 NOTE — Progress Notes (Signed)
 "  Subjective:    Patient ID: Jose Austin, male    DOB: 08-01-1961, 64 y.o.   MRN: [id number]  HPI  Patient is a 64 y/o man here for CPE.  He has a history of hypertension and hyperlipidemia.  PSA was checked 8/25 and was 0.5.  Cologuard was negative 04/2024 and is due again in 04/2027.  Appears to be due for Prevnar 20, shingrix, and flu shot. Wt Readings from Last 3 Encounters:  09/03/24 273 lb (123.8 kg)  04/08/24 284 lb 6.4 oz (129 kg)  02/17/23 289 lb (131.1 kg)   Since I last saw the patient, he has changed his diet.  He has cut out soda and he is trying to eat better.  He has lost 16 pounds.  I congratulated him on that.  His blood pressure today is outstanding at 122/80.  He declines the vaccinations today.  He states that he will consider a pneumonia and a shingles shot at a later date.  Previously he was having episodes of tachycardia.  14-day monitor showed 1 episode of SVT.  Since the patient has stopped drinking caffeine, he states that the palpitations have improved dramatically.  He denies any symptoms of sleep apnea such as hypersomnolence, snoring, or paroxysmal nocturnal dyspnea Immunization History  Administered Date(s) Administered   Fluzone Influenza virus vaccine,trivalent (IIV3), split virus 05/21/2013   Influenza Inj Mdck Quad Pf 07/08/2021   Influenza Split 04/19/2011   Influenza,inj,Quad PF,6+ Mos 05/16/2020   PFIZER(Purple Top)SARS-COV-2 Vaccination 11/08/2019, 12/01/2019    Past Medical History:  Diagnosis Date   Anxiety    Atypical chest pain    ED (erectile dysfunction)    GERD (gastroesophageal reflux disease)    Hyperlipidemia    Hypertension    Past Surgical History:  Procedure Laterality Date   CHOLECYSTECTOMY N/A 10/25/2014   Procedure: LAPAROSCOPIC CHOLECYSTECTOMY;  Surgeon: Vicenta Poli, MD;  Location: [facility] OR;  Service: General;  Laterality: N/A;   HERNIA REPAIR     Current Outpatient Medications on File Prior to Visit  Medication Sig  Dispense Refill   aspirin EC 81 MG tablet Take 81 mg by mouth daily. (Patient taking differently: Take 81 mg by mouth daily. Takes every other day.)     atorvastatin  (LIPITOR) 20 MG tablet TAKE 1 TABLET(20 MG) BY MOUTH DAILY 90 tablet 0   losartan  (COZAAR ) 25 MG tablet TAKE 1 TABLET(25 MG) BY MOUTH DAILY 90 tablet 1   sildenafil (VIAGRA) 100 MG tablet Take by mouth.     No current facility-administered medications on file prior to visit.   No Known Allergies  Social History   Socioeconomic History   Marital status: Single    Spouse name: Not on file   Number of children: Not on file   Years of education: Not on file   Highest education level: Associate degree: occupational, scientist, product/process development, or vocational program  Occupational History   Occupation: [employer]    Comment: works 14 hours a day  Tobacco Use   Smoking status: Former    Current packs/day: 0.00    Types: Cigarettes    Quit date: 08/12/1998    Years since quitting: 26.0   Smokeless tobacco: Never  Substance and Sexual Activity   Alcohol use: Never    Alcohol/week: 0.0 standard drinks of alcohol   Drug use: Never   Sexual activity: Yes  Other Topics Concern   Not on file  Social History Narrative   Drinks 2-3 sodas daily.   Social Drivers  of Health   Tobacco Use: Medium Risk (04/08/2024)   Patient History    Smoking Tobacco Use: Former    Smokeless Tobacco Use: Never    Passive Exposure: Not on file  Financial Resource Strain: Low Risk (09/01/2024)   Overall Financial Resource Strain (CARDIA)    Difficulty of Paying Living Expenses: Not very hard  Food Insecurity: No Food Insecurity (09/01/2024)   Epic    Worried About Programme Researcher, Broadcasting/film/video in the Last Year: Never true    Ran Out of Food in the Last Year: Never true  Transportation Needs: No Transportation Needs (09/01/2024)   Epic    Lack of Transportation (Medical): No    Lack of Transportation (Non-Medical): No  Physical Activity: Insufficiently Active  (09/01/2024)   Exercise Vital Sign    Days of Exercise per Week: 2 days    Minutes of Exercise per Session: 20 min  Stress: Stress Concern Present (09/01/2024)   [employer] of Occupational Health - Occupational Stress Questionnaire    Feeling of Stress: To some extent  Social Connections: Moderately Integrated (09/01/2024)   Social Connection and Isolation Panel    Frequency of Communication with Friends and Family: Once a week    Frequency of Social Gatherings with Friends and Family: Once a week    Attends Religious Services: 1 to 4 times per year    Active Member of Clubs or Organizations: Yes    Attends Banker Meetings: 1 to 4 times per year    Marital Status: Married  Catering Manager Violence: Not on file  Depression (PHQ2-9): Low Risk (04/08/2024)   Depression (PHQ2-9)    PHQ-2 Score: 0  Alcohol Screen: Low Risk (09/01/2024)   Alcohol Screen    Last Alcohol Screening Score (AUDIT): 1  Housing: Unknown (09/01/2024)   Epic    Unable to Pay for Housing in the Last Year: No    Number of Times Moved in the Last Year: Not on file    Homeless in the Last Year: No  Utilities: Not on file  Health Literacy: Not on file    Review of Systems  All other systems reviewed and are negative.      Objective:   Physical Exam Vitals reviewed.  Constitutional:      General: He is not in acute distress.    Appearance: Normal appearance. He is obese. He is not ill-appearing, toxic-appearing or diaphoretic.  HENT:     Head: Normocephalic and atraumatic.     Right Ear: Tympanic membrane and ear canal normal.     Left Ear: Tympanic membrane and ear canal normal.     Nose: Nose normal. No congestion or rhinorrhea.     Mouth/Throat:     Mouth: Mucous membranes are moist.     Pharynx: Oropharynx is clear. No oropharyngeal exudate or posterior oropharyngeal erythema.  Neck:     Vascular: No carotid bruit.  Cardiovascular:     Rate and Rhythm: Normal rate and regular  rhythm.     Heart sounds: Normal heart sounds. No murmur heard.    No gallop.  Pulmonary:     Effort: Pulmonary effort is normal. No respiratory distress.     Breath sounds: Normal breath sounds. No stridor. No wheezing, rhonchi or rales.  Abdominal:     General: Bowel sounds are normal. There is no distension.     Palpations: Abdomen is soft.     Tenderness: There is no abdominal tenderness.  Musculoskeletal:  General: No swelling, tenderness, deformity or signs of injury.     Cervical back: Normal range of motion. No rigidity or tenderness.     Right lower leg: No edema.     Left lower leg: No edema.  Lymphadenopathy:     Cervical: No cervical adenopathy.  Skin:    General: Skin is warm.     Coloration: Skin is not jaundiced or pale.     Findings: No bruising, erythema, lesion or rash.  Neurological:     General: No focal deficit present.     Mental Status: He is alert and oriented to person, place, and time. Mental status is at baseline.     Cranial Nerves: No cranial nerve deficit.     Motor: No weakness.     Coordination: Coordination normal.     Gait: Gait normal.           Assessment & Plan:  Essential hypertension - Plan: CBC with Differential/Platelet, Comprehensive metabolic panel with GFR, Lipid panel  Hyperlipidemia, unspecified hyperlipidemia type - Plan: CBC with Differential/Platelet, Comprehensive metabolic panel with GFR, Lipid panel  Encounter for general adult medical examination without abnormal findings Recommended the pneumonia vaccine, flu shot, and a shingles vaccine.  Patient politely declined these.  Prostate cancer and colon cancer screening are up-to-date.  I will check CBC CMP and lipid panel.  Congratulated the patient on his weight loss.  Encouraged him to continue with therapeutic lifestyle changes.  The remainder of his physical exam is normal "

## 2024-09-07 ENCOUNTER — Ambulatory Visit: Payer: Self-pay | Admitting: Family Medicine

## 2024-09-11 ENCOUNTER — Other Ambulatory Visit: Payer: Self-pay | Admitting: Family Medicine

## 2024-09-11 DIAGNOSIS — I10 Essential (primary) hypertension: Secondary | ICD-10-CM
